# Patient Record
Sex: Male | Born: 1945 | Race: White | Hispanic: No | Marital: Married | Smoking: Current some day smoker
Health system: Southern US, Community
[De-identification: ages and names within clinical notes are randomized; demographics above are authoritative.]

## PROBLEM LIST (undated history)

## (undated) DIAGNOSIS — Z974 Presence of external hearing-aid: Secondary | ICD-10-CM

## (undated) DIAGNOSIS — E669 Obesity, unspecified: Secondary | ICD-10-CM

## (undated) DIAGNOSIS — J45909 Unspecified asthma, uncomplicated: Secondary | ICD-10-CM

## (undated) DIAGNOSIS — Z720 Tobacco use: Secondary | ICD-10-CM

## (undated) DIAGNOSIS — J309 Allergic rhinitis, unspecified: Secondary | ICD-10-CM

## (undated) DIAGNOSIS — E7800 Pure hypercholesterolemia, unspecified: Secondary | ICD-10-CM

## (undated) DIAGNOSIS — C61 Malignant neoplasm of prostate: Secondary | ICD-10-CM

## (undated) DIAGNOSIS — G4733 Obstructive sleep apnea (adult) (pediatric): Secondary | ICD-10-CM

## (undated) DIAGNOSIS — Z860100 Personal history of colon polyps, unspecified: Secondary | ICD-10-CM

## (undated) DIAGNOSIS — I4719 Other supraventricular tachycardia: Secondary | ICD-10-CM

## (undated) DIAGNOSIS — R001 Bradycardia, unspecified: Secondary | ICD-10-CM

## (undated) DIAGNOSIS — M109 Gout, unspecified: Secondary | ICD-10-CM

## (undated) DIAGNOSIS — R1319 Other dysphagia: Secondary | ICD-10-CM

## (undated) DIAGNOSIS — R7303 Prediabetes: Secondary | ICD-10-CM

## (undated) DIAGNOSIS — I471 Supraventricular tachycardia: Secondary | ICD-10-CM

## (undated) DIAGNOSIS — Z6841 Body Mass Index (BMI) 40.0 and over, adult: Secondary | ICD-10-CM

## (undated) DIAGNOSIS — I491 Atrial premature depolarization: Secondary | ICD-10-CM

## (undated) DIAGNOSIS — R0600 Dyspnea, unspecified: Secondary | ICD-10-CM

## (undated) DIAGNOSIS — N529 Male erectile dysfunction, unspecified: Secondary | ICD-10-CM

## (undated) DIAGNOSIS — I10 Essential (primary) hypertension: Secondary | ICD-10-CM

## (undated) HISTORY — DX: Presence of external hearing-aid: Z97.4

## (undated) HISTORY — DX: Allergic rhinitis, unspecified: J30.9

## (undated) HISTORY — DX: Obesity, unspecified: E66.9

## (undated) HISTORY — DX: Body Mass Index (BMI) 40.0 and over, adult: Z684

## (undated) HISTORY — DX: Dyspnea, unspecified: R06.00

## (undated) HISTORY — DX: Personal history of colon polyps, unspecified: Z86.0100

## (undated) HISTORY — DX: Other supraventricular tachycardia: I47.19

## (undated) HISTORY — DX: Malignant neoplasm of prostate: C61

## (undated) HISTORY — PX: KNEE ARTHROSCOPY: SUR90

## (undated) HISTORY — PX: HEEL SPUR SURGERY: SHX665

## (undated) HISTORY — DX: Bradycardia, unspecified: R00.1

## (undated) HISTORY — DX: Atrial premature depolarization: I49.1

## (undated) HISTORY — DX: Unspecified asthma, uncomplicated: J45.909

## (undated) HISTORY — PX: CHOLECYSTECTOMY: SHX55

## (undated) HISTORY — PX: HEMICOLECTOMY: SHX854

## (undated) HISTORY — DX: Other dysphagia: R13.19

## (undated) HISTORY — DX: Personal history of colonic polyps: Z86.010

## (undated) HISTORY — DX: Prediabetes: R73.03

## (undated) HISTORY — DX: Male erectile dysfunction, unspecified: N52.9

## (undated) HISTORY — DX: Tobacco use: Z72.0

## (undated) HISTORY — DX: Obstructive sleep apnea (adult) (pediatric): G47.33

## (undated) HISTORY — PX: PROSTATE SURGERY: SHX751

## (undated) HISTORY — DX: Gout, unspecified: M10.9

## (undated) HISTORY — DX: Supraventricular tachycardia: I47.1

## (undated) HISTORY — DX: Pure hypercholesterolemia, unspecified: E78.00

---

## 1998-02-28 ENCOUNTER — Ambulatory Visit (HOSPITAL_COMMUNITY): Admission: RE | Admit: 1998-02-28 | Discharge: 1998-02-28 | Payer: Self-pay | Admitting: Gastroenterology

## 1999-09-14 ENCOUNTER — Encounter: Payer: Self-pay | Admitting: *Deleted

## 1999-09-14 ENCOUNTER — Encounter: Admission: RE | Admit: 1999-09-14 | Discharge: 1999-09-14 | Payer: Self-pay | Admitting: *Deleted

## 2001-07-24 ENCOUNTER — Encounter: Payer: Self-pay | Admitting: *Deleted

## 2001-07-24 ENCOUNTER — Encounter: Admission: RE | Admit: 2001-07-24 | Discharge: 2001-07-24 | Payer: Self-pay | Admitting: *Deleted

## 2002-08-24 ENCOUNTER — Encounter: Admission: RE | Admit: 2002-08-24 | Discharge: 2002-08-24 | Payer: Self-pay

## 2003-06-25 ENCOUNTER — Encounter (INDEPENDENT_AMBULATORY_CARE_PROVIDER_SITE_OTHER): Payer: Self-pay | Admitting: Specialist

## 2003-06-25 ENCOUNTER — Ambulatory Visit (HOSPITAL_COMMUNITY): Admission: RE | Admit: 2003-06-25 | Discharge: 2003-06-25 | Payer: Self-pay | Admitting: Gastroenterology

## 2004-04-21 ENCOUNTER — Encounter: Admission: RE | Admit: 2004-04-21 | Discharge: 2004-04-21 | Payer: Self-pay | Admitting: Family Medicine

## 2004-10-17 ENCOUNTER — Ambulatory Visit (HOSPITAL_COMMUNITY): Admission: RE | Admit: 2004-10-17 | Discharge: 2004-10-17 | Payer: Self-pay | Admitting: Chiropractic Medicine

## 2006-06-11 ENCOUNTER — Encounter: Admission: RE | Admit: 2006-06-11 | Discharge: 2006-06-11 | Payer: Self-pay | Admitting: Cardiology

## 2006-06-12 ENCOUNTER — Ambulatory Visit (HOSPITAL_COMMUNITY): Admission: RE | Admit: 2006-06-12 | Discharge: 2006-06-12 | Payer: Self-pay | Admitting: Cardiology

## 2007-03-21 ENCOUNTER — Emergency Department (HOSPITAL_COMMUNITY): Admission: EM | Admit: 2007-03-21 | Discharge: 2007-03-21 | Payer: Self-pay | Admitting: Emergency Medicine

## 2008-10-07 ENCOUNTER — Encounter (INDEPENDENT_AMBULATORY_CARE_PROVIDER_SITE_OTHER): Payer: Self-pay | Admitting: Surgery

## 2008-10-07 ENCOUNTER — Inpatient Hospital Stay (HOSPITAL_COMMUNITY): Admission: RE | Admit: 2008-10-07 | Discharge: 2008-10-11 | Payer: Self-pay | Admitting: Surgery

## 2008-10-08 DIAGNOSIS — C61 Malignant neoplasm of prostate: Secondary | ICD-10-CM

## 2008-10-08 HISTORY — DX: Malignant neoplasm of prostate: C61

## 2009-12-13 ENCOUNTER — Encounter: Admission: RE | Admit: 2009-12-13 | Discharge: 2009-12-13 | Payer: Self-pay | Admitting: Family Medicine

## 2010-11-16 ENCOUNTER — Other Ambulatory Visit: Payer: Self-pay | Admitting: Gastroenterology

## 2011-01-22 LAB — BASIC METABOLIC PANEL
Chloride: 111 mEq/L (ref 96–112)
Creatinine, Ser: 0.73 mg/dL (ref 0.4–1.5)
GFR calc non Af Amer: >60 mL/min (ref >60)
Glucose, Bld: 103 mg/dL — ABNORMAL HIGH (ref 70–99)

## 2011-01-22 LAB — CBC
HCT: 31 % — ABNORMAL LOW (ref 39.0–52.0)
MCHC: 33.8 g/dL (ref 30.0–36.0)
MCV: 88.1 fL (ref 78.0–100.0)
Platelets: 251 10*3/uL (ref 150–400)

## 2011-02-01 ENCOUNTER — Other Ambulatory Visit (HOSPITAL_COMMUNITY): Payer: Self-pay | Admitting: Urology

## 2011-02-01 ENCOUNTER — Other Ambulatory Visit: Payer: Self-pay | Admitting: Urology

## 2011-02-01 ENCOUNTER — Encounter (HOSPITAL_COMMUNITY): Payer: 59

## 2011-02-01 ENCOUNTER — Ambulatory Visit (HOSPITAL_COMMUNITY)
Admission: RE | Admit: 2011-02-01 | Discharge: 2011-02-01 | Disposition: A | Discharge status: Home / Self Care | Payer: 59 | Source: Ambulatory Visit | Attending: Urology | Admitting: Urology

## 2011-02-01 ENCOUNTER — Other Ambulatory Visit (HOSPITAL_COMMUNITY): Payer: Medicare Other

## 2011-02-01 DIAGNOSIS — Z01818 Encounter for other preprocedural examination: Secondary | ICD-10-CM | POA: Insufficient documentation

## 2011-02-01 DIAGNOSIS — Z01812 Encounter for preprocedural laboratory examination: Secondary | ICD-10-CM | POA: Insufficient documentation

## 2011-02-01 DIAGNOSIS — Z9089 Acquired absence of other organs: Secondary | ICD-10-CM | POA: Insufficient documentation

## 2011-02-01 DIAGNOSIS — F17200 Nicotine dependence, unspecified, uncomplicated: Secondary | ICD-10-CM | POA: Insufficient documentation

## 2011-02-01 DIAGNOSIS — C61 Malignant neoplasm of prostate: Secondary | ICD-10-CM | POA: Insufficient documentation

## 2011-02-01 DIAGNOSIS — Z01810 Encounter for preprocedural cardiovascular examination: Secondary | ICD-10-CM | POA: Insufficient documentation

## 2011-02-01 LAB — CBC
MCH: 28.3 pg (ref 26.0–34.0)
MCHC: 32.8 g/dL (ref 30.0–36.0)
RBC: 5.08 MIL/uL (ref 4.22–5.81)
RDW: 12.9 % (ref 11.5–15.5)
WBC: 6.7 10*3/uL (ref 4.0–10.5)

## 2011-02-01 LAB — SURGICAL PCR SCREEN: Staphylococcus aureus: NEGATIVE

## 2011-02-01 LAB — BASIC METABOLIC PANEL
BUN: 12 mg/dL (ref 6–23)
CO2: 31 mEq/L (ref 19–32)
GFR calc Af Amer: >60 mL/min (ref >60)
Sodium: 141 mEq/L (ref 135–145)

## 2011-02-06 HISTORY — PX: PROSTATECTOMY: SHX69

## 2011-02-08 ENCOUNTER — Other Ambulatory Visit: Payer: Self-pay | Admitting: Urology

## 2011-02-08 ENCOUNTER — Inpatient Hospital Stay (HOSPITAL_COMMUNITY)
Admission: RE | Admit: 2011-02-08 | Discharge: 2011-02-09 | DRG: 708 | Disposition: A | Discharge status: Home / Self Care | Payer: Medicare Other | Source: Ambulatory Visit | Attending: Urology | Admitting: Urology

## 2011-02-08 DIAGNOSIS — C61 Malignant neoplasm of prostate: Principal | ICD-10-CM | POA: Diagnosis present

## 2011-02-08 DIAGNOSIS — F17200 Nicotine dependence, unspecified, uncomplicated: Secondary | ICD-10-CM | POA: Diagnosis present

## 2011-02-08 DIAGNOSIS — J45909 Unspecified asthma, uncomplicated: Secondary | ICD-10-CM | POA: Diagnosis present

## 2011-02-08 LAB — TYPE AND SCREEN
ABO/RH(D): A NEG
Antibody Screen: NEGATIVE

## 2011-02-08 LAB — ABO/RH: ABO/RH(D): A NEG

## 2011-02-08 LAB — HEMOGLOBIN AND HEMATOCRIT, BLOOD: HCT: 40.2 % (ref 39.0–52.0)

## 2011-02-10 NOTE — Op Note (Signed)
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  POSTOPERATIVE DIAGNOSIS:  Same.  PROCEDURES:  Robotic assisted laparoscopic radical prostatectomy (bilateral nerve sparing).  SURGEON:  Heloise Purpura, M.D.  ASSISTANT:  Delia Chimes, NP  ANESTHESIA:  General.  COMPLICATIONS:  None.  ESTIMATED BLOOD LOSS:  100 mL  INTRAVENOUS FLUIDS:  1700 mL crystalloid.  SPECIMEN:  Prostate and seminal vesicles.  DISPOSITION:  Specimen to pathology.  DRAINS: 1. 20-French coude catheter. 2. #19 Blake pelvic drain.  INDICATION:  Danny Lee is a 65 year old gentleman with clinically localized prostate cancer.  After a discussion regarding management options for treatment, he elected to proceed with surgical therapy and the above procedure.  The potential risks, complications, and alternative treatment options were discussed in detail and informed consent obtained.  DESCRIPTION OF PROCEDURE:  The patient was taken to the operating room, and a general anesthetic was administered.  He was given preoperative antibiotics, placed in the dorsal lithotomy position, and prepped and draped in the usual sterile fashion.  Preoperative time-out was performed.  A Foley catheter was inserted into the bladder and a site was selected just inferior into the left of the umbilicus for placement of the camera port.  The patient is significantly obese, and there was some difficulty gaining access to the peritoneum, although this was done with an open Hassan technique and under direct vision and  without complications.  A long 150-mm 12-mm camera port was then placed and a pneumoperitoneum established.  With the 0-degree lens, the abdomen was inspected, and there was no evidence of any intra-abdominal injuries or other abnormalities except for adhesions in the right upper quadrant related to his prior surgery.  However, these adhesions were not in the operative field for his planned prostatectomy.  The 8-mm robotic ports were then placed in the right lower quadrant, left lower quadrant, and far left lower quadrant.  A 5-mm port was placed in the right upper quadrant and a 12-mm port was placed in the far right lateral abdominal wall for laparoscopic assistance.  All ports were placed under direct vision without difficulty.  The surgical cart was then docked.  With the aid of the cautery scissors, the bladder was reflected posteriorly allowing entering the space of Retzius and identification of the endopelvic fascia and prostate.  The periprosthetic fat was then removed, and the underlying endopelvic fascia was incised from the apex back to the base of the prostate bilaterally and the underlying levator muscle fibers were swept laterally off the prostate thereby isolating the dorsal venous complex.  The dorsal vein was then stapled and divided with a 45-mm flex echelon stapler.  The bladder neck was identified with the aid of Foley catheter manipulation and  was divided anteriorly.  This exposed Foley catheter, and the catheter balloon was deflated thereby allowing the catheter be brought into the operative field and used to retract the prostate anteriorly.  The posterior bladder neck was then divided and dissection proceeded between the prostate and bladder until the vasa deferentia and seminal vesicles were identified.  The vasa deferentia were isolated, divided, and lifted anteriorly, and the seminal vesicles were dissected down to their tips with care to control seminal vesicle  arterial blood supply.  These structures were then also lifted anteriorly, and the space between Denonvilliers fascia and the anterior rectum was bluntly developed thereby isolating the vascular pedicles of the prostate.  The lateral prostatic fascia was then sharply incised allowing the neurovascular bundles to be released.  The vascular pedicles of the prostate were then ligated with Hem-o-lok clips above the level of the neurovascular bundles, and the vascular pedicles were then divided with sharp cold scissor dissection.  The neurovascular bundles were then swept off the apex of the prostate and urethra, and urethra was sharply transected allowing the prostate specimen to be disarticulated.  The pelvis was then copiously irrigated.  Hemostasis ensured.  There was no evidence for rectal injury.  Attention then turned to the urethral anastomosis.  A 2-0 Vicryl slip-knot was placed between Denonvilliers fascia, the posterior bladder neck, and the posterior urethra to reapproximate these structures.  A double-armed 3-0 Monocryl suture was then used to perform a 396-degree running tension- free anastomosis between these structures.  A new 20-French coude catheter was inserted into the bladder and irrigated.  There were no blood clots within the bladder, and the anastomosis appeared be watertight.  A #19 Blake drain was brought through the left robotic port and positioned appropriately within the pelvis.  It was secured to skin with a nylon suture.  The surgical cart was then undocked.  The right lateral 12-mm port site was closed with a 0 Vicryl suture placed laparoscopically.  The prostate specimen was removed intact within the Endopouch retrieval bag via the periumbilical port site.  This fascial opening was then closed with two running 0 Vicryl sutures.  All port sites were injected with quarter percent Marcaine and reapproximated at the skin level with staples.  Sterile dressings were  applied.  The patient appeared to tolerate procedure well and without complications. He was able to be extubated and transferred to the recovery unit in satisfactory condition.     Heloise Purpura, MD     LB/MEDQ  D:  02/08/2011  T:  02/08/2011  Job:  [id number]  Electronically Signed by Heloise Purpura MD on 02/10/2011 04:19:56 PM

## 2011-02-20 NOTE — Op Note (Signed)
NAME:  Danny Lee, HALIBURTON NO.:  [id number]   MEDICAL RECORD NO.:  [id number]          PATIENT TYPE:  INP   LOCATION:  0002                         FACILITY:  [facility]   PHYSICIAN:  [name], MD     DATE OF BIRTH:  [birth year]   DATE OF PROCEDURE:  DATE OF DISCHARGE:                               OPERATIVE REPORT   PRIMARY CARE PHYSICIAN:  [name], M.D.   GASTROENTEROLOGIST:  [name], M.D.   CARDIOLOGIST:  [name], M.D. with [facility].   SURGEON:  [name], M.D.   ASSISTANT:  [name]. [name], M.D.   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   POSTOPERATIVE DIAGNOSES:  1. Incompletely resected polyp of the ascending colon with high-grade      dysplasia.  2. Terminal ileal question scarring versus polyposis.   PROCEDURE PERFORMED:  Laparoscopically-assisted right hemicolectomy.  Laparoscopic lysis of adhesions.   ANESTHESIA:  1. General anesthesia.  2. Local anesthetic in a field block at all port sites and extraction      site.  3. On-Q pain pump.   DRAINS:  None.   ESTIMATED BLOOD LOSS:  50 mL   COMPLICATIONS:  None apparent.   INDICATIONS:  Mr. Danny Lee is a 65 year old morbidly obese male who was  found to have a large polyp near his cecum on a follow-up colonoscopy  with history of prior colon polyps.  [name] was able to resect some  of the polyp but not all of it.  Therefore surgical consultation was  requested.   Anatomy and physiology of the abdominal tract was discussed.  Pathophysiology of colon polyposis was discussed.  The possibility of a  colon cancer hidden within a polyp, especially with high-grade  dysplasia, was noted as well.  Options discussed.  Recommendation was  made for a segmental colectomy with laparoscopic versus possible open  technique.  Risks, benefits, and alternatives were discussed, questions  answered, and he  agreed to proceed.   OPERATIVE FINDINGS:  He had some moderate adhesions in his right upper  quadrant from his prior cholecystectomy.  He had about a 1-cm polyp base  at the junction of the cecal and ascending colon.  His ileocecal valve  was a little bit thickened and rough and there may be a few tiny polyps,  but the proximal ileum margin appeared clear, as well as the anastomosis  seemed clear.  He had some mild ileocecal mesentery thickening.  There  was no evidence of any metastatic disease or other abnormalities.  There  was no evidence of an incisional hernia..   DESCRIPTION OF PROCEDURE:  Informed consent was confirmed.  The patient  received IV antibiotic  prior to surgery.  He received oral alvimopan  just prior to surgery.  He received 5000 units of subcutaneous heparin  prior to surgery.  SCDs were on the entire case.  He underwent general  anesthesia without difficulty.  He was positioned in the low lithotomy  with arms tucked.  He had a Foley catheter sterilely placed.  His  abdomen was clipped, prepped and draped in a sterile fashion.   A 5-mm port was placed in the left upper quadrant with the patient in  steep reverse Trendelenburg and left side up after orogastric tube had  evacuated the stomach.  Entry was made in the abdomen without any  difficulty.  Camera inspection revealed no intra-abdominal injury.  Under direct visualization, 5-mm ports were placed in the left lower  quadrant, the umbilicus infraumbilically, and in the right flank.   The camera inspection revealed findings as noted above.  There were some  dense omental adhesions in the right upper quadrant.  I therefore  decided to do a mediolateral approach.  The ileocecal junction was  elevated anteriorly to identify the ileocecal pedicle.  The terminal  ileal mesentery was scored.  I was able to get into the retromesenteric  plane (Toldt fascia).  I was able to free off the right colon mesentery  off the  retroperitoneal attachments.  The duodenum could be seen and it  was kept posteriorly as the mesentery was swept off this.  This was  carried over proximally to the mid transverse colon and laterally  towards the cecum.   The ileocecal mesentery was scored, followed more proximally, and high  ligation of the ileocecal vessels was done using a LigaSure to good  result.   The colon was mobilized in a lateral medial fashion, first mobilizing  the cecum off its lateral pelvic sidewall attachments.  It was followed  up the right colon up to the hepatic flexure.  I followed more  proximally and freed off the terminal ileal mesentery as well all the  way to the midline for excellent mobilization.   The greater omentum was freed off its attachments on the anterior  abdominal wall along from its prior right subcostal incision and allowed  to reflect above the liver.  I was able to get into the lesser sac  between the transverse colon and greater omentum and free the mid and  proximal transverse mesocolon off its omental retroperitoneal  attachments, entering into the pocket that I had created inferiorly.  With that I was able to free off the hepatic flexure and provide  excellent mobilization.   A clamp was placed on the mesoappendix for identification.  Capnoperitoneum was evacuated.  I did a 6-cm oblique incision within his  prior open cholecystectomy incision and a wound protector was placed.  The right colon was eviscerated.  I could feel a mass near the ileocecal  valve and felt like I had adequate proximal and distal margins.  A side-  to-side stapled anastomosis was done using a GIA stapler.  The staple  line was closed using a TA  60 stapler.  The mesentery was taken in a  ray-like fashion to have good proximal and distal margins with excellent  high ligation of the ileocecal blood vessels.   I opened up the specimen on the table and found 1-cm base polyp that  seemed to correlate  with Dr. [name] pictures.  I could feel a little  bit of hardness and empty ileocecal valve but the more proximal end of  the ileum seemed clear.   The ileocolonic mesenteric defect was closed using interrupted 3-0 silk  figure-of-eight stitches as proximally as we could through the wound and  the wound protector was clamped and capnoperitoneum was reinstituted.  Proximally the colonic mesentery was closed using 3-0 Vicryl  intracorporeal suturing to good results.  I  placed a couple of silk  stitches at the proximal end of the ileocolonic side-to-side anastomosis  to keep tension off the staple line.  I ran the small bowel from the  ileocolonic anastomosis to the ligament of Treitz and the small bowel  laid well with no evidence of any internal hernias or turnings or  twists.  The greater omentum was allowed to fall down.  Irrigation was  done around the pelvis right gutter and the liver with clear return.  Hemostasis was excellent.  Capnoperitoneum was evacuated.  Ports were  removed.  I went ahead and placed the On-Q pain pump and closed the  right upper quadrant incision in 2 layers using #1 PDS, taking the  posterior rectus fascia in 1 layer and the transversalis and posterior  rectus fascia deeply and then the external oblique and the anterior  rectus fascia anteriorly.  The wound was irrigated copiously and closed  using interrupted 4-0 Monocryl subcuticular stitch.  Telfa wicks were  placed, antibiotic ointment was placed and sterile dressings applied.  The rest of the incisions were closed using a 4-0 Monocryl stitch with  sterile dressings applied as well.   The patient was extubated and sent to the recovery room in stable  condition.  I discussed postoperative care with the patient in detail  and I will discuss it with his wife.      [name], MD  Electronically Signed     SCG/MEDQ  D:  10/07/2008  T:  10/07/2008  Job:  [id number]   cc:   [name], M.D.  Fax:  [phone]   [name], M.D.  Fax: [phone]

## 2011-02-23 NOTE — Discharge Summary (Signed)
NAME:  Danny Lee, Danny Lee NO.:  [id number]   MEDICAL RECORD NO.:  [id number]          PATIENT TYPE:  INP   LOCATION:  1530                         [facility]ACILITY:  [facility]   PHYSICIAN:  [name], MD     DATE OF BIRTH:  01/01/1946   DATE OF ADMISSION:  10/07/2008  DATE OF DISCHARGE:  10/11/2008                               DISCHARGE SUMMARY   PRIMARY CARE PHYSICIAN:  [name], M.D.   GASTROENTEROLOGIST:  [name], M.D.   SURGEON:  [name], M.D.   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   OTHER DIAGNOSES:  1. Recurrent colon polyps.  2. Hypercholesterolemia.  3. Gout.  4. Asthma.  5. Erectile dysfunction.   PROCEDURE PERFORMED:  Laparoscopically assisted right hemicolectomy with  ileocolonic anastomosis on October 07, 2008.   HOSPITAL COURSE:  Mr. Suleiman is a 65 year old gentleman who had some  recurrent polyps and had a polyp in the ascending colon with some high-  grade dysplasia noted that was not able to be completely safely  resected.  [name] requested surgical consultation.  He was cleared by  Dr. Anne Fu of [facility].  The patient underwent laparoscopic  surgery.  Postoperatively he was advanced on his diet and had On-Q pain  pump in place.  He was able to ambulate.  By the time of discharge he  was tolerating a solid diet and without any fever or chills or nausea.  His pain was controlled on oral medications.  Pathology came back  consistent with stumps of tubovillous adenoma x2 with no cancer noted in  the specimen nor in 0 of 21 lymph nodes and no evidence of high-grade  dysplasia.  Based on improvement it was thought to be reasonable to  discharge home with the following instructions:   DISCHARGE INSTRUCTIONS:  1. He is to return to see me in about 2 weeks.  2. He should advance his diet as tolerated.  Call if he has any      fevers, chills, sweats, nausea or  vomiting, worsening pain,      drainage from his incisions or other concerns.  3. He should walk at least half hour daily and remain physically      active.  4. He should resume his home medications which include:   MEDICATIONS:  1. Crestor 10 mg daily.  2. Aspirin 81 mg daily.  3. Cialis p.r.n.  4. Aleve p.r.n.  5. Oxycodone 5-15 mg p.o. q.4 hours p.r.n. pain.  6. ________ p.r.n.  7. Phenergan p.r.n.      [name], MD  Electronically Signed     SCG/MEDQ  D:  11/27/2008  T:  11/27/2008  Job:  [id number]   cc:   [name], M.D.  Fax: [phone]   [name], M.D.  Fax: [phone]

## 2011-02-23 NOTE — Op Note (Signed)
NAME:  KAYNE, YUHAS NO.:  [id number]   MEDICAL RECORD NO.:  [id number]                   PATIENT TYPE:  AMB   LOCATION:  ENDO                                 FACILITY:  [facility]   PHYSICIAN:  [name], M.D.                 DATE OF BIRTH:  1946-04-30   DATE OF PROCEDURE:  06/25/2003  DATE OF DISCHARGE:  06/25/2003                                 OPERATIVE REPORT   PROCEDURE:  Colonoscopy with hot biopsy.   ENDOSCOPIST:  [name], M.D.   INDICATIONS FOR PROCEDURE:  The patient has a history of colon polyps and is  here for repeat screening.   INFORMED CONSENT:  Consent was signed after risks, benefits, methods and  options were thoroughly discussed in the past.   MEDICATIONS USED:  Demerol 50 mg, Versed 6 mg.   DESCRIPTION OF PROCEDURE:  Rectal inspection was pertinent for external  hemorrhoids.  Digital exam was negative.   The video colonoscope was inserted and easily advanced around the colon to  the cecum.  No abnormalities were seen on insertion.  The cecum was  identified by the appendiceal orifice and the ileocecal valve.  The scope  was inserted a short way into the terminal ileum.  Prep was adequate.  There  was some liquid stool that required washing and suctioning.  On slow  withdrawal through the colon, the cecum and the ascending were normal.  In  the more proximal transverse, a tiny polyp was seen and was hot biopsied x1.  On slow withdrawal through the left side of the colon no abnormalities were  seen.  Anorectal pull through on retroflexion confirmed some small  hemorrhoids.  The scope was reinserted a short way up the left side of the  colon.  Air was suctioned.  The scope was removed.  The patient tolerated  the procedure well.  There was no obvious immediate complication.   ENDOSCOPIC DIAGNOSES:  1. Internal and external hemorrhoids.  2. Tiny transverse polyp hot biopsied.  3. Otherwise within normal limits to the  cecum and the terminal ileum.    PLAN:  1. Happy to see back p.r.n.  2. Await pathology and will probable recheck screening in five years,     otherwise return care to Dr. Excell Seltzer for the customary health care     maintenance to include yearly rectals and guaiacs.                                               [name], M.D.    MEM/MEDQ  D:  06/25/2003  T:  06/28/2003  Job:  [id number]   cc:   Christella Noa, M.D.  [address] [place]  [place], [place] [address]  Fax: [phone]

## 2011-03-12 NOTE — Discharge Summary (Signed)
  NAME:  SOLON, ALBAN NO.:  [id number]  MEDICAL RECORD NO.:  [id number]           PATIENT TYPE:  I  LOCATION:  1441                         [facility]ACILITY[facility]  PHYSICIAN:  Heloise Purpura, MD      DATE OF BIRTH:  29-Nov-1945  DATE OF ADMISSION:  02/08/2011 DATE OF DISCHARGE:  02/09/2011                              DISCHARGE SUMMARY   ADMISSION DIAGNOSIS:  Clinically localized adenocarcinoma of the prostate (clinical stage T1c, NX, MX).  DISCHARGE DIAGNOSIS:  Clinically localized adenocarcinoma of the prostate (clinical stage T1c, NX, MX).  PROCEDURE:  Robotic-assisted laparoscopic radical prostatectomy (bilateral nerve sparing).  HISTORY AND PHYSICAL:  For full details, please see admission history and physical.  Briefly, Mr. Nagengast is a 65 year old gentleman who was found to have clinically localized adenocarcinoma of the prostate. After careful consideration regarding management options for treatment, he elected to proceed with surgical therapy and robotic-assisted laparoscopic radical prostatectomy.  HOSPITAL COURSE:  On Feb 08, 2011, he was taken to the operating room where he underwent the above named procedure which he tolerated well and without complications.  Postoperatively he was able to be transferred to a regular hospital room following recovery from anesthesia.  He was found to be hemodynamically stable as noted by hemoglobin of 13.5.  He was also found to be hemodynamically stable on postoperative day #2 as noted by hemoglobin of 12.4.  He was able to begin ambulation and clear liquid diet postoperatively.  He was able to continue this in the postoperative day #2.  He was found to have excellent urine output with minimal output from his pelvic drain on postoperative day #1. Therefore, his pelvic drain was removed.  He was ambulating without difficulty.  His pain was well managed and he was tolerating a clear liquid diet on the afternoon of  postoperative day #1.  Therefore he was felt stable for discharge home as he had met all discharge criteria.  DISPOSITION:  Home.  DISCHARGE MEDICATIONS:  He was instructed to resume all home medications consisting of Artificial Tears and Crestor.  In addition he was instructed to hold his herbal supplements and multivitamins as well as aspirin for 7 days.  He was provided a prescription for Vicodin to take as needed for pain and told to use Colace as a stool softener.  He was also given a prescription for Cipro to begin 1 day prior to return visit for removal of Foley catheter.  FOLLOWUP:  He will follow up in 7 to 10 days for further postoperative evaluation.     Delia Chimes, NP   ______________________________ Heloise Purpura, MD    MA/MEDQ  D:  03/01/2011  T:  03/01/2011  Job:  [id number]  Electronically Signed by Delia Chimes NP on 03/12/2011 10:01:20 AM Electronically Signed by Heloise Purpura MD on 03/12/2011 08:07:00 PM

## 2011-07-13 LAB — HEMOGLOBIN AND HEMATOCRIT, BLOOD: Hemoglobin: 14.3 g/dL (ref 13.0–17.0)

## 2011-07-26 LAB — DIFFERENTIAL
Eosinophils Absolute: 0.2
Eosinophils Relative: 3
Lymphocytes Relative: 23
Lymphs Abs: 1.5
Monocytes Relative: 8
Neutrophils Relative %: 66

## 2011-07-26 LAB — URINALYSIS, ROUTINE W REFLEX MICROSCOPIC
Bilirubin Urine: NEGATIVE
Glucose, UA: NEGATIVE
Hgb urine dipstick: NEGATIVE
Nitrite: NEGATIVE
Protein, ur: NEGATIVE
Urobilinogen, UA: 0.2

## 2011-07-26 LAB — BASIC METABOLIC PANEL
Calcium: 9.3
Creatinine, Ser: 0.69
GFR calc Af Amer: >60
Glucose, Bld: 109 — ABNORMAL HIGH
Potassium: 4.2
Sodium: 140

## 2011-07-26 LAB — CBC: WBC: 6.5

## 2011-11-10 ENCOUNTER — Emergency Department (HOSPITAL_BASED_OUTPATIENT_CLINIC_OR_DEPARTMENT_OTHER)
Admission: EM | Admit: 2011-11-10 | Discharge: 2011-11-10 | Disposition: A | Discharge status: Home / Self Care | Payer: Medicare Other | Attending: Emergency Medicine | Admitting: Emergency Medicine

## 2011-11-10 ENCOUNTER — Encounter (HOSPITAL_BASED_OUTPATIENT_CLINIC_OR_DEPARTMENT_OTHER): Payer: Self-pay | Admitting: *Deleted

## 2011-11-10 ENCOUNTER — Emergency Department (INDEPENDENT_AMBULATORY_CARE_PROVIDER_SITE_OTHER): Payer: Medicare Other

## 2011-11-10 DIAGNOSIS — W19XXXA Unspecified fall, initial encounter: Secondary | ICD-10-CM | POA: Insufficient documentation

## 2011-11-10 DIAGNOSIS — Z79899 Other long term (current) drug therapy: Secondary | ICD-10-CM | POA: Insufficient documentation

## 2011-11-10 DIAGNOSIS — IMO0002 Reserved for concepts with insufficient information to code with codable children: Secondary | ICD-10-CM

## 2011-11-10 DIAGNOSIS — Y9289 Other specified places as the place of occurrence of the external cause: Secondary | ICD-10-CM | POA: Insufficient documentation

## 2011-11-10 DIAGNOSIS — S63279A Dislocation of unspecified interphalangeal joint of unspecified finger, initial encounter: Secondary | ICD-10-CM | POA: Insufficient documentation

## 2011-11-10 DIAGNOSIS — I10 Essential (primary) hypertension: Secondary | ICD-10-CM | POA: Insufficient documentation

## 2011-11-10 DIAGNOSIS — S63259A Unspecified dislocation of unspecified finger, initial encounter: Secondary | ICD-10-CM

## 2011-11-10 HISTORY — DX: Essential (primary) hypertension: I10

## 2011-11-10 MED ORDER — OXYCODONE-ACETAMINOPHEN 5-325 MG PO TABS
1.0000 | ORAL_TABLET | Freq: Once | ORAL | Status: AC
  Administered 2011-11-10: 1 via ORAL
  Filled 2011-11-10: qty 1

## 2011-11-10 NOTE — ED Notes (Signed)
Pt tripped and fell landing on his left hand. Left little finger with obvious deformity. Moves slightly. Feels touch. Cap refill < 3 sec.

## 2011-11-10 NOTE — ED Provider Notes (Signed)
History   This chart was scribed for Gerhard Munch, MD by Melba Coon. The patient was seen in room MH09/MH09 and the patient's care was started at 4:10PM.    CSN: [id number]  Arrival date & time 11/10/11  1525   First MD Initiated Contact with Patient 11/10/11 1601      Chief Complaint  Patient presents with  . Finger Injury    (Consider location/radiation/quality/duration/timing/severity/associated sxs/prior treatment) HPI Danny Lee is a 66 y.o. male who presents to the Emergency Department complaining of constant, moderate to severe left hand (5th digit) pain pertaining to a fall with an onset today. Pt tripped on concrete and fell, landing on his left hand to brace his fall. Pt also hit his left knee but pt states it was fine. Finger is not mobile. Pt has arthritis and takes meds for it. No surgeries on left hand, but fractured left wrist several years ago.. No other pertinent medical problems.  Past Medical History  Diagnosis Date  . Hypertension     Past Surgical History  Procedure Date  . Prostate surgery     History reviewed. No pertinent family history.  History  Substance Use Topics  . Smoking status: Never Smoker   . Smokeless tobacco: Current User  . Alcohol Use: No      Review of Systems 10 Systems reviewed and are negative for acute change except as noted in the HPI.  Allergies  Review of patient's allergies indicates not on file.  Home Medications   Current Outpatient Rx  Name Route Sig Dispense Refill  . AMLODIPINE BESYLATE 5 MG PO TABS Oral Take 5 mg by mouth daily.    . ASPIRIN EC 81 MG PO TBEC Oral Take 81 mg by mouth daily.    . OMEGA-3 FATTY ACIDS 1000 MG PO CAPS Oral Take 2 g by mouth daily.    [name] LOSARTAN POTASSIUM 50 MG PO TABS Oral Take 50 mg by mouth daily.    . MELOXICAM 15 MG PO TABS Oral Take 15 mg by mouth daily.    . ADULT MULTIVITAMIN W/MINERALS CH Oral Take 1 tablet by mouth daily.    [name] SIMVASTATIN 40 MG PO TABS Oral  Take 40 mg by mouth every evening.      BP 117/71  Pulse 70  Temp(Src) 97.8 F (36.6 C) (Oral)  Resp 20  Ht 5\' 6"  (1.676 m)  Wt 240 lb (108.863 kg)  BMI 38.74 kg/m2  SpO2 96%  Physical Exam  Nursing note and vitals reviewed. Constitutional: He is oriented to person, place, and time. He appears well-developed and well-nourished.  HENT:  Head: Normocephalic and atraumatic.  Mouth/Throat: Oropharynx is clear and moist.  Eyes: Conjunctivae and EOM are normal. Pupils are equal, round, and reactive to light. No scleral icterus.  Neck: Normal range of motion. Neck supple. No thyromegaly present.  Cardiovascular: Normal rate, regular rhythm and normal heart sounds.  Exam reveals no gallop and no friction rub.   No murmur heard. Pulmonary/Chest: Effort normal and breath sounds normal. No stridor. He has no wheezes. He has no rales. He exhibits no tenderness.  Abdominal: Soft. Bowel sounds are normal. He exhibits no distension. There is no tenderness. There is no rebound.  Musculoskeletal: He exhibits edema (left hand 5th digit) and tenderness (left hand 5th digit).       Left hand: He exhibits decreased range of motion (left hand 5th digit) and deformity (left hand 5th digit).  Lymphadenopathy:    He  has no cervical adenopathy.  Neurological: He is alert and oriented to person, place, and time. Coordination normal.  Skin: Skin is warm. No rash noted. No erythema.  Psychiatric: He has a normal mood and affect. His behavior is normal.    ED Course  Reduction of dislocation Date/Time: 11/10/2011 6:00 PM Performed by: Gerhard Munch Authorized by: Gerhard Munch Consent: Verbal consent obtained. The procedure was performed in an emergent situation. Time out: Immediately prior to procedure a "time out" was called to verify the correct patient, procedure, equipment, support staff and site/side marked as required. Local anesthesia used: yes Anesthesia: see MAR for details Patient  sedated: no Patient tolerance: Patient tolerated the procedure well with no immediate complications. Comments: L fifth digit reduced on first attempt w no complications. Patient tolerated the procedure well.  Digit splinted w positioning maintained.   (including critical care time)  DIAGNOSTIC STUDIES: Oxygen Saturation is 96% on room air, adequate by my interpretation.    COORDINATION OF CARE:  4:20PM - EDMD makes pt aware of XR results; finger is not broken but dislocated; consults with pt about how he's going to fix dislocation and possible complications; EDMD relocates finger and puts a splint on it; checked distal pulses; EDMD plans for d/c and f/u with orthopedic or PCP; pt agrees with plan for d/c   Labs Reviewed - No data to display No results found.   No diagnosis found.  XR reviewed by me.  MDM  This generally well M p/w dislocated PIP fifth L digit.  XR demonstrate "tiny avulsion volar plate fracture."  Digit reduced on first attempt.  Patient splinted and d/c w ortho f/u.      Gerhard Munch, MD 11/11/11 [phone]

## 2014-02-19 ENCOUNTER — Encounter: Payer: Self-pay | Admitting: General Surgery

## 2014-02-19 DIAGNOSIS — R002 Palpitations: Secondary | ICD-10-CM | POA: Insufficient documentation

## 2014-02-19 DIAGNOSIS — E782 Mixed hyperlipidemia: Secondary | ICD-10-CM | POA: Insufficient documentation

## 2014-02-19 DIAGNOSIS — I10 Essential (primary) hypertension: Secondary | ICD-10-CM | POA: Insufficient documentation

## 2014-03-11 ENCOUNTER — Encounter: Payer: Self-pay | Admitting: Cardiology

## 2014-03-11 ENCOUNTER — Ambulatory Visit (INDEPENDENT_AMBULATORY_CARE_PROVIDER_SITE_OTHER): Payer: Medicare Other | Admitting: Cardiology

## 2014-03-11 VITALS — BP 115/68 | HR 95 | Ht 66.0 in | Wt 256.8 lb

## 2014-03-11 DIAGNOSIS — R0609 Other forms of dyspnea: Secondary | ICD-10-CM | POA: Insufficient documentation

## 2014-03-11 DIAGNOSIS — R0789 Other chest pain: Secondary | ICD-10-CM

## 2014-03-11 DIAGNOSIS — R002 Palpitations: Secondary | ICD-10-CM

## 2014-03-11 DIAGNOSIS — I498 Other specified cardiac arrhythmias: Secondary | ICD-10-CM

## 2014-03-11 DIAGNOSIS — R001 Bradycardia, unspecified: Secondary | ICD-10-CM

## 2014-03-11 DIAGNOSIS — I10 Essential (primary) hypertension: Secondary | ICD-10-CM

## 2014-03-11 DIAGNOSIS — R0602 Shortness of breath: Secondary | ICD-10-CM

## 2014-03-11 NOTE — Patient Instructions (Signed)
Your physician has requested that you have an echocardiogram. Echocardiography is a painless test that uses sound waves to create images of your heart. It provides your doctor with information about the size and shape of your heart and how well your heart's chambers and valves are working. This procedure takes approximately one hour. There are no restrictions for this procedure.  Your physician has recommended that you wear an event monitor. Event monitors are medical devices that record the heart's electrical activity. Doctors most often [place] these monitors to diagnose arrhythmias. Arrhythmias are problems with the speed or rhythm of the heartbeat. The monitor is a small, portable device. You can wear one while you do your normal daily activities. This is usually used to diagnose what is causing palpitations/syncope (passing out).  Your physician has requested that you have en exercise stress myoview. For further information please visit HugeFiesta.tn. Please follow instruction sheet, as given.  Your physician recommends that you schedule a follow-up appointment in: 4 weeks with Dr. Radford Pax.

## 2014-03-11 NOTE — Progress Notes (Addendum)
Patient ID: Danny Lee, male   DOB: Nov 01, 1945, 68 y.o.   MRN: [id number]  [address], [place], [place]  [address] Phone: [phone] Fax:  [phone]  Date:  03/11/2014   ID:  Danny Lee, DOB 09-11-1946, MRN [id number]  PCP:  Gara Kroner, MD  Cardiologist:  Fransico Him, MD   Electrophysiologist:  none   History of Present Illness: Danny Lee is a 68 y.o. male who presents for evaluation of bradycardia.   Patient reports he had an EKG a few weeks ago and HR was low. He was then referred here for evaluation.  Patient reports left sided chest pain. Lasts less than 30 seconds. Sharp pain. No radiation. No shortness of breath with this in particular. No diaphoresis. Not exertional. Nuclear stress test in 2009 per PCP note was low risk. No chest pressure. Occurs once a week.  Patient also reports a strange sensation in his chest "that is not a fluttering" and occurs when he is just sitting around. Not worse with exertion. Not much caffeine intake. No drug use. No alcohol use. Smokes cigars once in a while. This sensation occurs two times a month. Couple years duration. Not getting more prominent. No dizziness with this.   Patient also notes that he gets short of breath when walking to his building and when he walks up 15 steps. No orthopnea or PND. Sometimes ankles swell, but no other leg swelling. Has history of sleep apnea. Uses CPAP. Diagnosed with OSA in August.   Per the PCPs last note the patient had an echo 11/09 with normal EF, mild left atrial enlargement, and no significant valvular abnormalities.   Wt Readings from Last 3 Encounters:  03/11/14 256 lb 12.8 oz (116.484 kg)  11/10/11 240 lb (108.863 kg)     Past Medical History  Diagnosis Date  . Hypertension   . Hypercholesteremia   . Gout     rare  . Asthma     mild  . ED (erectile dysfunction)   . Obesity   . Dyspnea     Nuc stress 11/09-low rish,nl EF,ECHO 11/09-nl EF, mild left  atrial enlargement(4.4cm) no signifcant vavlular abnormalities.   . Prostate cancer 2010  . Hearing aid worn     Bilateral  . OSA (obstructive sleep apnea)     SPLIT 06/16/13 ESS 10,AHI 36/hr REM 71/hr, RDI same, O2 minn 68%, CPAP 12 w AHI 2, APAP recommeded    Current Outpatient Prescriptions  Medication Sig Dispense Refill  . aspirin EC 81 MG tablet Take 81 mg by mouth daily.      [name] atorvastatin (LIPITOR) 40 MG tablet Take 40 mg by mouth every morning.      . fish oil-omega-3 fatty acids 1000 MG capsule Take 2 g by mouth daily.      [name] losartan (COZAAR) 50 MG tablet Take 100 mg by mouth daily.       . Multiple Vitamin (MULITIVITAMIN WITH MINERALS) TABS Take 1 tablet by mouth daily.      [name] oxybutynin (DITROPAN-XL) 10 MG 24 hr tablet Take 10 mg by mouth at bedtime.       No current facility-administered medications for this visit.    Allergies:    Allergies  Allergen Reactions  . Amlodipine Besylate Other (See Comments)    LE edema on 5 MG     Social History:  The patient  reports that he has been smoking Cigars.  He uses smokeless tobacco.  He reports that he does not drink alcohol or use illicit drugs.   Family History:  The patient's family history includes CAD in his brother; Hypertension in his brother.   ROS:  Please see the history of present illness.   All other systems reviewed and negative.   PHYSICAL EXAM: VS:  BP 115/68  Pulse 95  Ht 5\' 6"  (1.676 m)  Wt 256 lb 12.8 oz (116.484 kg)  BMI 41.47 kg/m2 Well nourished, well developed, in no acute distress HEENT: normal Neck: no JVD, no carotid bruits Cardiac:  normal S1, S2; brady regular; no murmur Lungs:  clear to auscultation bilaterally, no wheezing, rhonchi or rales Abd: soft, nontender, no hepatomegaly Ext: no edema Skin: warm and dry Neuro:  CNs 2-12 intact, no focal abnormalities noted  EKG:  Sinus brady, no ST or T wave changes (done at outside office)     ASSESSMENT AND PLAN:  1. Sinus Bradycardia on  no rate slowing meds 2. Dyspnea on exertion of unclear etiology ? Coronary ischemia vs. Bradycardia with low CO vs. obesity 3. Atypical chest pain but with CRF including obesity, HTN, dyslipidemia and family history of CAD 4. Palpitations 5. HTN - well controlled 6. Hyperlipidemia  Patient with multiple risk factors for coronary disease, including HTN, HLD, and smoking. The concern given the constellation of symptoms is that his symptoms could be related to a cardiac cause. Additionally his sinus bradycardia could be dropping to lower levels leading to his palpitations and funny feeling in his chest and additionally causing his dyspnea. Other possibilities for causes of his dyspnea could be OSA/OHS, obesity and deconditioning.  Given his symptoms we will order a stress myoview to evaluate for CAD and to look for an chronotropic response with exercise given his bradycardia. We will order an echo to evaluate his heart for causes of dyspnea - assess LVF and diastolic function An event monitor will be used to evaluate his palpitations.  He will follow-up in 4 weeks with Dr Radford Pax.   Signed, Tommi Rumps, MD 03/11/2014 1:40 PM    The patient was seen, examined, interviewed and data reviewed.  Agree with note as outlined above with minor modifications.  Complains of SOB of unclear etiology - need to evaluate for coronary ischemia, symptomatic bradycardia.  Will get 2D echo, event monitor and stress myoview.  Followup with me in 4 weeks  Signed: Fransico Him, MD 03/11/2014

## 2014-03-15 ENCOUNTER — Encounter: Payer: Self-pay | Admitting: *Deleted

## 2014-03-15 ENCOUNTER — Encounter (INDEPENDENT_AMBULATORY_CARE_PROVIDER_SITE_OTHER): Payer: Medicare Other

## 2014-03-15 DIAGNOSIS — R002 Palpitations: Secondary | ICD-10-CM

## 2014-03-15 NOTE — Progress Notes (Signed)
Patient ID: Danny Lee, male   DOB: [birth year], 68 y.o.   MRN: [id number] [employer] 30 day cardiac event monitor applied to patient.

## 2014-03-31 ENCOUNTER — Ambulatory Visit (HOSPITAL_COMMUNITY): Payer: Medicare Other | Attending: Cardiology | Admitting: Radiology

## 2014-03-31 ENCOUNTER — Ambulatory Visit (HOSPITAL_BASED_OUTPATIENT_CLINIC_OR_DEPARTMENT_OTHER): Payer: Medicare Other | Admitting: Radiology

## 2014-03-31 ENCOUNTER — Other Ambulatory Visit: Payer: Self-pay | Admitting: General Surgery

## 2014-03-31 VITALS — BP 93/65 | HR 50 | Ht 66.0 in | Wt 251.0 lb

## 2014-03-31 DIAGNOSIS — R079 Chest pain, unspecified: Secondary | ICD-10-CM

## 2014-03-31 DIAGNOSIS — R0600 Dyspnea, unspecified: Secondary | ICD-10-CM

## 2014-03-31 DIAGNOSIS — R0602 Shortness of breath: Secondary | ICD-10-CM

## 2014-03-31 MED ORDER — PERFLUTREN PROTEIN A MICROSPH IV SUSP
1.5000 mL | Freq: Once | INTRAVENOUS | Status: AC
  Administered 2014-03-31: 1.5 mL via INTRAVENOUS

## 2014-03-31 MED ORDER — TECHNETIUM TC 99M SESTAMIBI GENERIC - CARDIOLITE
30.0000 | Freq: Once | INTRAVENOUS | Status: AC | PRN
  Administered 2014-03-31: 30 via INTRAVENOUS

## 2014-03-31 NOTE — Progress Notes (Signed)
[facility] [address] [place], [place] [address] [phone]    Cardiology Nuclear Med Study  Danny Lee is a 68 y.o. male     MRN : [id number]     DOB: [birth year]  Procedure Date: 03/31/2014  Nuclear Med Background Indication for Stress Test:  Evaluation for Ischemia History:  No prior known history of CAD, 2009 Myocardial Perfusion Imaging-Normal, EF=62% Cardiac Risk Factors: Family History - CAD, Hypertension, Lipids and Smoker  Symptoms: Chest Pain with/without exertion (last occurrence yesterday), DOE and Palpitations   Nuclear Pre-Procedure Caffeine/Decaff Intake:  None NPO After: 7:00pm   Lungs:  clear O2 Sat: 95% on room air. IV 0.9% NS with Angio Cath:  22g  IV Site: R Hand  IV Started by:  Crissie Figures, RN  Chest Size (in):  50 Cup Size: n/a  Height: 5\' 6"  (1.676 m)  Weight:  251 lb (113.853 kg)  BMI:  Body mass index is 40.53 kg/(m^2). Tech Comments:  Patient took Losartan last night. Irven Baltimore, RN.    Nuclear Med Study 1 or 2 day study: 2 day  Stress Test Type:  Stress  Reading MD: N/A  Order Authorizing Provider:  Fransico Him, MD  Resting Radionuclide: Technetium 32m Sestamibi  Resting Radionuclide Dose: 33.0 mCi on 04/05/14   Stress Radionuclide:  Technetium 80m Sestamibi  Stress Radionuclide Dose: 33.0 mCi on 03/31/14           Stress Protocol Rest HR: 45 Stress HR: 133  Rest BP: 93/65 Stress BP: 192/69  Exercise Time (min): 6:00 METS: 7.0   Predicted Max HR: 152 bpm % Max HR: 87.5 bpm Rate Pressure Product: 25536   Dose of Adenosine (mg):  n/a Dose of Lexiscan: n/a mg  Dose of Atropine (mg): n/a Dose of Dobutamine: n/a mcg/kg/min (at max HR)  Stress Test Technologist: Irven Baltimore, RN  Nuclear Technologist:  Annye Rusk, CNMT     Rest Procedure:  Myocardial perfusion imaging was performed at rest 45 minutes following the intravenous administration of Technetium 82m Sestamibi. Rest ECG: NSR - Normal  EKG  Stress Procedure:  The patient exercised on the treadmill utilizing the Bruce Protocol for 6:00 minutes, RPE=16. The patient stopped due to DOE, fatigue and denied any chest pain. There was a hypertensive response to exercise from baseline. Technetium 74m Sestamibi was injected at peak exercise and myocardial perfusion imaging was performed after a brief delay. Stress ECG: No significant change from baseline ECG  QPS Raw Data Images:  Normal; no motion artifact; normal heart/lung ratio. Stress Images:  Normal homogeneous uptake in all areas of the myocardium. Rest Images:  Normal homogeneous uptake in all areas of the myocardium. Subtraction (SDS):  No evidence of ischemia. Transient Ischemic Dilatation (Normal <1.22):  1.05 Lung/Heart Ratio (Normal <0.45):  0.37  Quantitative Gated Spect Images QGS EDV:  135 ml QGS ESV:  61 ml  Impression Exercise Capacity:  Good exercise capacity. BP Response:  Normal blood pressure response. Clinical Symptoms:  No significant symptoms noted. ECG Impression:  No significant ST segment change suggestive of ischemia. Comparison with Prior Nuclear Study: No images to compare  Overall Impression:  Normal stress nuclear study.  LV Ejection Fraction: 55%.  LV Wall Motion:  NL LV Function; NL Wall Motion  Darlin Coco MD

## 2014-03-31 NOTE — Progress Notes (Signed)
Echocardiogram performed.  

## 2014-04-01 ENCOUNTER — Other Ambulatory Visit (INDEPENDENT_AMBULATORY_CARE_PROVIDER_SITE_OTHER): Payer: Medicare Other

## 2014-04-01 DIAGNOSIS — R0602 Shortness of breath: Secondary | ICD-10-CM

## 2014-04-01 LAB — BRAIN NATRIURETIC PEPTIDE: Pro B Natriuretic peptide (BNP): 13 pg/mL (ref 0.0–100.0)

## 2014-04-01 NOTE — Progress Notes (Signed)
Quick Note:  Preliminary report reviewed by triage nurse and sent to MD desk. ______ 

## 2014-04-02 ENCOUNTER — Encounter: Payer: Self-pay | Admitting: General Surgery

## 2014-04-05 ENCOUNTER — Ambulatory Visit (HOSPITAL_COMMUNITY): Payer: Medicare Other | Attending: Cardiology

## 2014-04-05 DIAGNOSIS — R0989 Other specified symptoms and signs involving the circulatory and respiratory systems: Secondary | ICD-10-CM

## 2014-04-05 MED ORDER — TECHNETIUM TC 99M SESTAMIBI GENERIC - CARDIOLITE
30.0000 | Freq: Once | INTRAVENOUS | Status: AC | PRN
  Administered 2014-04-05: 30 via INTRAVENOUS

## 2014-04-06 ENCOUNTER — Telehealth: Payer: Self-pay | Admitting: Cardiology

## 2014-04-06 NOTE — Telephone Encounter (Signed)
Pt notified to send monitor back on July 8th. He was hoping to send back early. I explained it is a 30 day monitor and the Drs generally like pts to wear for the entire 30 days unless they were notified to take off early.

## 2014-04-06 NOTE — Telephone Encounter (Signed)
New message    Patient wants to know  How long should he wear his heart monitor.

## 2014-04-06 NOTE — Telephone Encounter (Signed)
Forward to [place] to call pt

## 2014-04-27 ENCOUNTER — Telehealth: Payer: Self-pay | Admitting: Cardiology

## 2014-04-27 NOTE — Telephone Encounter (Signed)
Please let patient know that heart monitor showed sinus bradycardia with lowest HR 41bpm during sleep and highest HR 101bpm.  He had frequent PAC's  that are benign

## 2014-04-27 NOTE — Telephone Encounter (Signed)
Pt is aware.  

## 2015-03-08 ENCOUNTER — Other Ambulatory Visit: Payer: Self-pay | Admitting: Family Medicine

## 2015-03-08 ENCOUNTER — Ambulatory Visit
Admission: RE | Admit: 2015-03-08 | Discharge: 2015-03-08 | Disposition: A | Discharge status: Home / Self Care | Payer: Medicare Other | Source: Ambulatory Visit | Attending: Family Medicine | Admitting: Family Medicine

## 2015-03-08 DIAGNOSIS — M545 Low back pain: Secondary | ICD-10-CM

## 2015-03-09 ENCOUNTER — Other Ambulatory Visit: Payer: Self-pay | Admitting: Family Medicine

## 2015-03-09 DIAGNOSIS — M5432 Sciatica, left side: Secondary | ICD-10-CM

## 2015-03-09 DIAGNOSIS — M545 Low back pain, unspecified: Secondary | ICD-10-CM

## 2015-03-11 ENCOUNTER — Ambulatory Visit
Admission: RE | Admit: 2015-03-11 | Discharge: 2015-03-11 | Disposition: A | Discharge status: Home / Self Care | Payer: Medicare Other | Source: Ambulatory Visit | Attending: Family Medicine | Admitting: Family Medicine

## 2015-03-11 DIAGNOSIS — M545 Low back pain, unspecified: Secondary | ICD-10-CM

## 2015-03-11 DIAGNOSIS — M5432 Sciatica, left side: Secondary | ICD-10-CM

## 2016-02-16 ENCOUNTER — Encounter: Payer: Self-pay | Admitting: Nurse Practitioner

## 2016-02-16 NOTE — Telephone Encounter (Signed)
Message For: ON CALL              Taken 11-MAY-17 at 11:13AM by LSJ ------------------------------------------------------------  Brandt Loosen              CID  [id number]   Patient  SAME                  Pt's Dr  Hassell Done        Area Code  336  Phone#  [address]  [phone] [place]                                                                                  Disp:Y/N  Y  If Y = C/B If No Response In 54minutes  ============================================================

## 2016-02-16 NOTE — Telephone Encounter (Signed)
This encounter was created in error - please disregard.

## 2016-04-03 ENCOUNTER — Other Ambulatory Visit: Payer: Self-pay | Admitting: Family Medicine

## 2016-04-03 ENCOUNTER — Ambulatory Visit
Admission: RE | Admit: 2016-04-03 | Discharge: 2016-04-03 | Disposition: A | Discharge status: Home / Self Care | Payer: Medicare Other | Source: Ambulatory Visit | Attending: Family Medicine | Admitting: Family Medicine

## 2016-04-03 DIAGNOSIS — M25561 Pain in right knee: Secondary | ICD-10-CM

## 2016-04-06 ENCOUNTER — Encounter: Payer: Self-pay | Admitting: Physician Assistant

## 2016-04-06 NOTE — Progress Notes (Signed)
Cardiology Office Note    Date:  04/09/2016  ID:  Danny Lee, DOB 12-May-1946, MRN [id number] PCP:  Danny Kroner, MD  Cardiologist:  Danny Lee in 2015  Chief Complaint: evaluate bradycardia  History of Present Illness:  Danny Lee is a 70 y.o. male with history of HTN, HLD, OSA, obesity, prostate CA, mild asthma, sinus bradycardia, brief PACs/PAT by monitor 2015 who presents for evaluation of bradycardia. He was evaluated in 2015 by Dr. Radford Lee for complaints of dyspnea and EKG showing HR of 45 at PCP's office. F/u event monitor showed <1% bradycardia - min HR 45, average HR 61, occasional PACs, few brief runs of non-sustained atrial tach (4-6 beats). 2D Echo 03/2014: EF 55%, grade 2 DD, no sig valvular abnormalities. Nuc 03/2014 was normal.  He presents back to clinic today for continued bradycardia. EKG from PCP's office 04/03/2016: sinus bradycardia 42bpm -> given that HR was slower than prior, formal consult was requested. He denies any specific symptoms related to this at rest. He occasionally feels dizzy when he bends over then stands up quickly. No history of presyncope or syncope. He does report fatigue on exertion but also states he is very sedentary in general. With brisk exertion in the office, his HR only got up to 63. Pulse ox WNL. He also reports rare fleeting ping of chest pain lasting only a few seconds in the middle of his chest, not worse with exertion or any other provocative measures. It resolves spontaneously. Labs from 04/03/16 show Na 142, K 4.2, Cl 106, BUN 14, Cr 0.82, normal LFTs, calcium 9.3, TSH 3.02, LDL 63, A1C 5.6, Hgb 13.2.  Past Medical History  Diagnosis Date  . Hypertension   . Hypercholesteremia   . Gout     rare  . Asthma     mild  . ED (erectile dysfunction)   . Obesity   . Dyspnea     a. Prior eval in 2009 nl. b.   . Prostate cancer ([place]) 2010  . Hearing aid worn     Bilateral  . OSA (obstructive sleep apnea)     SPLIT 06/16/13 ESS 10,AHI 36/hr  REM 71/hr, RDI same, O2 minn 68%, CPAP 12 w AHI 2, APAP recommeded  . Premature atrial contractions     a. By event monitor 2015.  [name] PAT (paroxysmal atrial tachycardia) ([place])     a. Brief nonsustained atrial tach on event monitor in 2015 - 4-6 beats.  . Sinus bradycardia     a. HR 45 in 2015.    Past Surgical History  Procedure Laterality Date  . Prostate surgery    . Hemicolectomy Right     and Lysis of adhesions  . Heel spur surgery    . Knee arthroscopy Left   . Prostatectomy  02/2011  . Cholecystectomy      Current Medications: Current Outpatient Prescriptions  Medication Sig Dispense Refill  . losartan (COZAAR) 100 MG tablet Take 1 tablet by mouth every morning.    [name] aspirin EC 81 MG tablet Take 81 mg by mouth daily.    [name] atorvastatin (LIPITOR) 40 MG tablet Take 40 mg by mouth every morning.    . fish oil-omega-3 fatty acids 1000 MG capsule Take 2 g by mouth daily.    . Multiple Vitamin (MULITIVITAMIN WITH MINERALS) TABS Take 1 tablet by mouth daily.    [name] oxybutynin (DITROPAN-XL) 10 MG 24 hr tablet Take 10 mg by mouth at bedtime.     No  current facility-administered medications for this visit.     Allergies:   Amlodipine besylate   Social History   Social History  . Marital Status: Married    Spouse Name: N/A  . Number of Children: N/A  . Years of Education: N/A   Social History Main Topics  . Smoking status: Current Some Day Smoker    Types: Cigars  . Smokeless tobacco: Current User  . Alcohol Use: No  . Drug Use: No  . Sexual Activity: Not Asked   Other Topics Concern  . None   Social History Narrative     Family History:  The patient's family history includes CAD in his brother; Heart attack in his brother; Hypertension in his brother.   ROS:   Please see the history of present illness.  All other systems are reviewed and otherwise negative.    PHYSICAL EXAM:   VS:  BP 115/68 mmHg  Pulse 53  Ht 5\' 6"  (1.676 m)  Wt 254 lb 6.4 oz (115.395 kg)   BMI 41.08 kg/m2  BMI: Body mass index is 41.08 kg/(m^2). GEN: Well nourished, well developed obese WM, in no acute distress HEENT: normocephalic, atraumatic Neck: no JVD, carotid bruits, or masses Cardiac: RRR; no murmurs, rubs, or gallops, no edema  Respiratory:  clear to auscultation bilaterally, normal work of breathing GI: soft, nontender, nondistended, + BS MS: no deformity or atrophy Skin: warm and dry, no rash Neuro:  Alert and Oriented x 3, Strength and sensation are intact, follows commands Psych: euthymic mood, full affect  Wt Readings from Last 3 Encounters:  04/09/16 254 lb 6.4 oz (115.395 kg)  03/11/15 247 lb (112.038 kg)  03/31/14 251 lb (113.853 kg)      Studies/Labs Reviewed:   EKG:  EKG was ordered today and personally reviewed by me and demonstrates SB 53bpm, sinus arrhythmia, TWI III, otherwise no acute changes.  Recent Labs: See above  Additional studies/ records that were reviewed today include: Summarized above.    ASSESSMENT & PLAN:   1. Sinus bradycardia - generally asymptomatic at rest, but he does report fatigue with exertion. I question the possibility of chronotropic incompetence. Will undertake ETT to formally assess. Given recent HR dropping as low as 42, will also update a 24-hour Holter to review HR trend. Note he is not on any AVN blocking agents and thyroid function was normal as above. Further management will depend on findings above. 2. Chest pain - highly atypical. As above, proceed with ETT. Anticipate continued observation for more typical symptoms. Continue ASA. 3. PACs/PAT by monitor - no symptoms reported. 4. Essential HTN - controlled on current regimen. 5. OSA - he reports compliance with CPAP.  Disposition: Will tentatively schedule f/u with EP in 2 weeks in case further discussions regarding PPM are needed following chronotropic assessment.   Medication Adjustments/Labs and Tests Ordered: Current medicines are reviewed at length  with the patient today.  Concerns regarding medicines are outlined above. Medication changes, Labs and Tests ordered today are summarized above and listed in the Patient Instructions accessible in Encounters.   Raechel Ache PA-C  04/09/2016 8:47 AM    [address], [place], [place]  [address] Phone: [phone]; Fax: [phone]

## 2016-04-09 ENCOUNTER — Encounter: Payer: Self-pay | Admitting: Physician Assistant

## 2016-04-09 ENCOUNTER — Ambulatory Visit (INDEPENDENT_AMBULATORY_CARE_PROVIDER_SITE_OTHER): Payer: Medicare Other | Admitting: Physician Assistant

## 2016-04-09 VITALS — BP 115/68 | HR 53 | Ht 66.0 in | Wt 254.4 lb

## 2016-04-09 DIAGNOSIS — I10 Essential (primary) hypertension: Secondary | ICD-10-CM | POA: Diagnosis not present

## 2016-04-09 DIAGNOSIS — Z9989 Dependence on other enabling machines and devices: Secondary | ICD-10-CM

## 2016-04-09 DIAGNOSIS — I491 Atrial premature depolarization: Secondary | ICD-10-CM | POA: Diagnosis not present

## 2016-04-09 DIAGNOSIS — G4733 Obstructive sleep apnea (adult) (pediatric): Secondary | ICD-10-CM | POA: Insufficient documentation

## 2016-04-09 DIAGNOSIS — R001 Bradycardia, unspecified: Secondary | ICD-10-CM

## 2016-04-09 DIAGNOSIS — I471 Supraventricular tachycardia: Secondary | ICD-10-CM

## 2016-04-09 NOTE — Patient Instructions (Signed)
Medication Instructions:  Your physician recommends that you continue on your current medications as directed. Please refer to the Current Medication list given to you today.   Labwork: -None  Testing/Procedures: Your physician has requested that you have an exercise tolerance test. For further information please visit HugeFiesta.[email or web address]. Please also follow instruction sheet, as given.  Your physician has recommended that you wear a holter monitor. Holter monitors are medical devices that record the heart's electrical activity. Doctors most often use these monitors to diagnose arrhythmias. Arrhythmias are problems with the speed or rhythm of the heartbeat. The monitor is a small, portable device. You can wear one while you do your normal daily activities. This is usually used to diagnose what is causing palpitations/syncope (passing out).    Follow-Up: Your physician recommends that you keep your scheduled  follow-up appointment with Dr. Curt Bears.   Any Other Special Instructions Will Be Listed Below (If Applicable).     If you need a refill on your cardiac medications before your next appointment, please call your pharmacy.

## 2016-04-12 ENCOUNTER — Other Ambulatory Visit: Payer: Self-pay | Admitting: Physician Assistant

## 2016-04-12 ENCOUNTER — Encounter: Payer: Self-pay | Admitting: Cardiology

## 2016-04-12 DIAGNOSIS — I491 Atrial premature depolarization: Secondary | ICD-10-CM

## 2016-04-12 DIAGNOSIS — R001 Bradycardia, unspecified: Secondary | ICD-10-CM

## 2016-04-12 DIAGNOSIS — R0789 Other chest pain: Secondary | ICD-10-CM

## 2016-04-12 DIAGNOSIS — R42 Dizziness and giddiness: Secondary | ICD-10-CM

## 2016-04-12 DIAGNOSIS — I471 Supraventricular tachycardia: Secondary | ICD-10-CM

## 2016-04-12 DIAGNOSIS — R5383 Other fatigue: Secondary | ICD-10-CM

## 2016-04-16 ENCOUNTER — Ambulatory Visit (INDEPENDENT_AMBULATORY_CARE_PROVIDER_SITE_OTHER): Payer: Medicare Other

## 2016-04-16 ENCOUNTER — Encounter (INDEPENDENT_AMBULATORY_CARE_PROVIDER_SITE_OTHER): Payer: Self-pay

## 2016-04-16 DIAGNOSIS — I471 Supraventricular tachycardia: Secondary | ICD-10-CM | POA: Diagnosis not present

## 2016-04-16 DIAGNOSIS — R001 Bradycardia, unspecified: Secondary | ICD-10-CM | POA: Diagnosis not present

## 2016-04-16 DIAGNOSIS — I491 Atrial premature depolarization: Secondary | ICD-10-CM

## 2016-04-16 DIAGNOSIS — R42 Dizziness and giddiness: Secondary | ICD-10-CM | POA: Diagnosis not present

## 2016-04-16 DIAGNOSIS — I4719 Other supraventricular tachycardia: Secondary | ICD-10-CM

## 2016-04-18 ENCOUNTER — Ambulatory Visit: Payer: Medicare Other | Admitting: Cardiology

## 2016-04-20 ENCOUNTER — Other Ambulatory Visit: Payer: Self-pay | Admitting: Gastroenterology

## 2016-04-24 ENCOUNTER — Ambulatory Visit: Payer: Medicare Other | Admitting: Cardiology

## 2016-04-25 ENCOUNTER — Telehealth: Payer: Self-pay | Admitting: Cardiology

## 2016-04-25 NOTE — Telephone Encounter (Signed)
Informed pt of monitor results. Pt already scheduled to see Dr.Camnitz on 7/26. Pt verbalized understanding and was in agreement with this plan.

## 2016-04-25 NOTE — Telephone Encounter (Signed)
F/u  Pt returning RN phone call- monitor results; Please call back and discuss.

## 2016-04-25 NOTE — Telephone Encounter (Signed)
New message ° ° ° ° ° ° °Pt returning nurse call  °

## 2016-05-02 ENCOUNTER — Ambulatory Visit: Payer: Medicare Other | Admitting: Cardiology

## 2016-05-03 ENCOUNTER — Ambulatory Visit (INDEPENDENT_AMBULATORY_CARE_PROVIDER_SITE_OTHER): Payer: Medicare Other

## 2016-05-03 DIAGNOSIS — R0789 Other chest pain: Secondary | ICD-10-CM

## 2016-05-03 DIAGNOSIS — R001 Bradycardia, unspecified: Secondary | ICD-10-CM

## 2016-05-03 DIAGNOSIS — R5383 Other fatigue: Secondary | ICD-10-CM

## 2016-05-03 LAB — EXERCISE TOLERANCE TEST
CHL CUP STRESS STAGE 1 DBP: 77 mmHg
CHL CUP STRESS STAGE 1 GRADE: 0 %
CHL CUP STRESS STAGE 1 HR: 80 {beats}/min
CHL CUP STRESS STAGE 1 SBP: 106 mmHg
CHL CUP STRESS STAGE 1 SPEED: 0 mph
CHL CUP STRESS STAGE 2 HR: 80 {beats}/min
CHL CUP STRESS STAGE 3 GRADE: 0 %
CHL CUP STRESS STAGE 3 SPEED: 1 mph
CHL CUP STRESS STAGE 4 GRADE: 0.1 %
CHL CUP STRESS STAGE 4 SPEED: 1 mph
CHL CUP STRESS STAGE 5 DBP: 68 mmHg
CHL CUP STRESS STAGE 5 HR: 101 {beats}/min
CHL CUP STRESS STAGE 6 DBP: 79 mmHg
CHL CUP STRESS STAGE 8 GRADE: 0 %
CHL CUP STRESS STAGE 8 HR: 106 {beats}/min
CHL CUP STRESS STAGE 8 SBP: 166 mmHg
CHL CUP STRESS STAGE 8 SPEED: 0 mph
CHL CUP STRESS STAGE 9 DBP: 61 mmHg
CHL CUP STRESS STAGE 9 GRADE: 0 %
CHL CUP STRESS STAGE 9 HR: 63 {beats}/min
CHL CUP STRESS STAGE 9 SBP: 122 mmHg
CSEPED: 7 min
CSEPEDS: 20 s
CSEPEW: 9 METS
CSEPHR: 87 %
CSEPPHR: 129 {beats}/min
CSEPPMHR: 86 %
MPHR: 150 {beats}/min
RPE: 17
Rest HR: 60 {beats}/min
Stage 2 Grade: 0 %
Stage 2 Speed: 0 mph
Stage 3 HR: 72 {beats}/min
Stage 4 HR: 71 {beats}/min
Stage 5 Grade: 10 %
Stage 5 SBP: 155 mmHg
Stage 5 Speed: 1.7 mph
Stage 6 Grade: 12 %
Stage 6 HR: 114 {beats}/min
Stage 6 SBP: 174 mmHg
Stage 6 Speed: 2.5 mph
Stage 7 Grade: 14 %
Stage 7 HR: 129 {beats}/min
Stage 7 Speed: 3.4 mph
Stage 8 DBP: 62 mmHg
Stage 9 Speed: 0 mph

## 2016-05-09 ENCOUNTER — Ambulatory Visit (INDEPENDENT_AMBULATORY_CARE_PROVIDER_SITE_OTHER): Payer: Medicare Other | Admitting: Cardiology

## 2016-05-09 ENCOUNTER — Encounter (INDEPENDENT_AMBULATORY_CARE_PROVIDER_SITE_OTHER): Payer: Self-pay

## 2016-05-09 ENCOUNTER — Encounter: Payer: Self-pay | Admitting: Cardiology

## 2016-05-09 VITALS — BP 110/72 | HR 60 | Ht 66.0 in | Wt 253.6 lb

## 2016-05-09 DIAGNOSIS — R001 Bradycardia, unspecified: Secondary | ICD-10-CM | POA: Diagnosis not present

## 2016-05-09 NOTE — Progress Notes (Signed)
Electrophysiology Office Note   Date:  05/09/2016   ID:  Lee Danny, DOB 13-Oct-1945, MRN [id number]  PCP:  Danny Kroner, MD  Cardiologist:  Danny Lee Primary Electrophysiologist:  Danny Lee Danny Leeds, MD    Chief Complaint  Patient presents with  . Follow-up     History of Present Illness: Danny Lee is a 70 y.o. male who presents today for electrophysiology evaluation.   Hx HTN, HLD, OSA, obesity, prostate CA, mild asthma, sinus bradycardia, brief PACs/PAT by monitor 2015 who presents for evaluation of bradycardia. He was evaluated in 2015 by Dr. Radford Lee for complaints of dyspnea and EKG showing HR of 45 at PCP's office. F/u event monitor showed <1% bradycardia - min HR 45, average HR 61, occasional PACs, few brief runs of non-sustained atrial tach (4-6 beats). 2D Echo 03/2014: EF 55%, grade 2 DD, no sig valvular abnormalities. Nuc 03/2014 was normal.   EKG from PCP's office 04/03/2016: sinus bradycardia 42bpm -> given that HR was slower than prior, formal consult was requested. No rest symptoms but dizzy when bends over and stands quickly. He does have DOE but lives a sedentary lifestyle.He says that he feels well most the time and does not have dizziness, lightheadedness. He was referred to cardiology clinic, as it was noted that his heart rate was slow in his primary physician's office.  Today, he denies symptoms of palpitations, chest pain, shortness of breath, orthopnea, PND, lower extremity edema, claudication, dizziness, presyncope, syncope, bleeding, or neurologic sequela. The patient is tolerating medications without difficulties and is otherwise without complaint today.    Past Medical History:  Diagnosis Date  . Asthma    mild  . Dyspnea    a. Prior eval in 2009 nl. b.   . ED (erectile dysfunction)   . Gout    rare  . Hearing aid worn    Bilateral  . Hypercholesteremia   . Hypertension   . Obesity   . OSA (obstructive sleep apnea)    SPLIT 06/16/13 ESS 10,AHI  36/hr REM 71/hr, RDI same, O2 minn 68%, CPAP 12 w AHI 2, APAP recommeded  . PAT (paroxysmal atrial tachycardia) ([place])    a. Brief nonsustained atrial tach on event monitor in 2015 - 4-6 beats.  . Premature atrial contractions    a. By event monitor 2015.  Danny Lee Prostate cancer ([name]) 2010  . Sinus bradycardia    a. HR 45 in 2015.   Past Surgical History:  Procedure Laterality Date  . CHOLECYSTECTOMY    . HEEL SPUR SURGERY    . HEMICOLECTOMY Right    and Lysis of adhesions  . KNEE ARTHROSCOPY Left   . PROSTATE SURGERY    . PROSTATECTOMY  02/2011     Current Outpatient Prescriptions  Medication Sig Dispense Refill  . aspirin EC 81 MG tablet Take 81 mg by mouth daily.    Danny Lee atorvastatin (LIPITOR) 40 MG tablet Take 40 mg by mouth every morning.    . fish oil-omega-3 fatty acids 1000 MG capsule Take 2 g by mouth daily.    Danny Lee losartan (COZAAR) 100 MG tablet Take 1 tablet by mouth every morning.    . Multiple Vitamin (MULITIVITAMIN WITH MINERALS) TABS Take 1 tablet by mouth daily.     No current facility-administered medications for this visit.     Allergies:   Amlodipine besylate   Social History:  The patient  reports that he has been smoking Cigars.  He uses smokeless tobacco. He reports that  he does not drink alcohol or use drugs.   Family History:  The patient's family history includes CAD in his brother; Heart attack in his brother; Hypertension in his brother.    ROS:  Please see the history of present illness.   Otherwise, review of systems is positive for none.   All other systems are reviewed and negative.    PHYSICAL EXAM: VS:  BP 110/72 (BP Location: Left Arm, Patient Position: Sitting, Cuff Size: Large)   Pulse 60   Ht 5\' 6"  (1.676 m)   Wt 253 lb 9.6 oz (115 kg)   SpO2 95%   BMI 40.93 kg/m  , BMI Body mass index is 40.93 kg/m. GEN: Well nourished, well developed, in no acute distress  HEENT: normal  Neck: no JVD, carotid bruits, or masses Cardiac: RRR; no murmurs,  rubs, or gallops,no edema  Respiratory:  clear to auscultation bilaterally, normal work of breathing GI: soft, nontender, nondistended, + BS MS: no deformity or atrophy  Skin: warm and dry Neuro:  Strength and sensation are intact Psych: euthymic mood, full affect  EKG:  EKG is not ordered today. Personal review of the ekg ordered 04/09/16 shows sinus bradycardia, rate 53   Recent Labs: No results found for requested labs within last 8760 hours.    Lipid Panel  No results found for: CHOL, TRIG, HDL, CHOLHDL, VLDL, LDLCALC, LDLDIRECT   Wt Readings from Last 3 Encounters:  05/09/16 253 lb 9.6 oz (115 kg)  04/09/16 254 lb 6.4 oz (115.4 kg)  03/11/15 247 lb (112 kg)      Other studies Reviewed: Additional studies/ records that were reviewed today include:  TTE 2015 - Normal LV size and systolic function, EF [id number]. Moderate diastolic dysfunction. Normal RV size and systolic function. No significant valvular abnormalities.  Holter 04/16/16  Sinus bradycardia, normal Sinus Rhythm and sinus tachycardia with average heart rate 66bpm. The heart rate ranged from 36 (during sleep) to 141 bpm.  Occasional PACs  Supraventricular tachycardia with several runs up to 35 beats in a row at 141bpm.  ETT 05/03/16   Blood pressure demonstrated a normal response to exercise.  There was no ST segment deviation noted during stress.  Exercise time 7 minutes and 20 seconds-good  Low risk exercise treadmill test with no electrocardiographic evidence of ischemia   ASSESSMENT AND PLAN:  1.  Sinus bradycardia - generally asymptomatic at rest, but he does report fatigue with exertion.On exercise treadmill testing, he was able to get his heart rate up to 129 bpm, with an initial heart rate of 60. He was bradycardic on his Holter monitor, but that was mainly when he was asleep. He did not have any other worrisome bradycardia episodes. Due to that, he does not need a pacemaker at this time. I told  him that if he does have worsening symptoms of fatigue, dyspnea, or weakness, to call the office back and we would be happy to see him. Would avoid rate controlling medications at this time.  2. Chest pain - highly atypical. ETT shows no evidence of ischemia  3. PACs/PAT by monitor - no symptoms reported.  4. Essential HTN - currently well controlled  5. OSA - wears CPAP   Current medicines are reviewed at length with the patient today.   The patient does not have concerns regarding his medicines.  The following changes were made today:  none  Labs/ tests ordered today include:  No orders of the defined types were placed in this  encounter.    Disposition:   FU with Courtne Lighty PRN  Signed, Milam Allbaugh Danny Leeds, MD  05/09/2016 8:34 AM     [facility] [address] [place] [address] [phone] (office) [phone] (fax)

## 2016-05-09 NOTE — Patient Instructions (Signed)
Medication Instructions:  Your physician recommends that you continue on your current medications as directed. Please refer to the Current Medication list given to you today.  * If you need a refill on your cardiac medications before your next appointment, please call your pharmacy.   Labwork: None ordered  Testing/Procedures: None ordered  Follow-Up: No follow up is needed at this time with Dr. Camnitz.  He will see you on an as needed basis.   Thank you for choosing [facility]!!   Ramy Greth, RN [phone]     

## 2016-10-15 ENCOUNTER — Other Ambulatory Visit: Payer: Self-pay | Admitting: Family Medicine

## 2016-10-15 ENCOUNTER — Ambulatory Visit
Admission: RE | Admit: 2016-10-15 | Discharge: 2016-10-15 | Disposition: A | Discharge status: Home / Self Care | Payer: Medicare Other | Source: Ambulatory Visit | Attending: Family Medicine | Admitting: Family Medicine

## 2016-10-15 DIAGNOSIS — R05 Cough: Secondary | ICD-10-CM

## 2016-10-15 DIAGNOSIS — R059 Cough, unspecified: Secondary | ICD-10-CM

## 2019-09-24 DIAGNOSIS — M25561 Pain in right knee: Secondary | ICD-10-CM | POA: Insufficient documentation

## 2020-04-04 ENCOUNTER — Telehealth: Payer: Self-pay | Admitting: Cardiology

## 2020-04-04 NOTE — Telephone Encounter (Signed)
New Message   Patient is calling because he would like for his wife to come to the appt with him due to his memory issues.

## 2020-04-04 NOTE — Telephone Encounter (Signed)
Left detailed message informing pt that someone can accompany him to appt w/ Camnitz this week. appt notes updated to reflect this.

## 2020-04-07 ENCOUNTER — Ambulatory Visit (INDEPENDENT_AMBULATORY_CARE_PROVIDER_SITE_OTHER): Payer: Medicare Other | Admitting: Cardiology

## 2020-04-07 ENCOUNTER — Telehealth: Payer: Self-pay | Admitting: *Deleted

## 2020-04-07 ENCOUNTER — Encounter: Payer: Self-pay | Admitting: Cardiology

## 2020-04-07 ENCOUNTER — Other Ambulatory Visit: Payer: Self-pay

## 2020-04-07 VITALS — BP 112/66 | HR 57 | Ht 66.0 in | Wt 254.0 lb

## 2020-04-07 DIAGNOSIS — R079 Chest pain, unspecified: Secondary | ICD-10-CM

## 2020-04-07 DIAGNOSIS — R0789 Other chest pain: Secondary | ICD-10-CM | POA: Diagnosis not present

## 2020-04-07 DIAGNOSIS — R001 Bradycardia, unspecified: Secondary | ICD-10-CM | POA: Diagnosis not present

## 2020-04-07 NOTE — Telephone Encounter (Signed)
Patient enrolled for [employer] to ship a 14 day ZIO XT long term holter monitor to [address] [place], [place] [address].

## 2020-04-07 NOTE — Patient Instructions (Signed)
Medication Instructions:  Your physician recommends that you continue on your current medications as directed. Please refer to the Current Medication list given to you today.  *If you need a refill on your cardiac medications before your next appointment, please call your pharmacy*   Lab Work: None ordered If you have labs (blood work) drawn today and your tests are completely normal, you will receive your results only by: [name] MyChart Message (if you have MyChart) OR . A paper copy in the mail If you have any lab test that is abnormal or we need to change your treatment, we will call you to review the results.   Testing/Procedures: Your physician has requested that you have cardiac CT. Cardiac computed tomography (CT) is a painless test that uses an x-ray machine to take clear, detailed pictures of your heart.  Please follow instructions below under "other instructions".  Your physician has recommended that you wear a 2 week holter monitor. Holter monitors are medical devices that record the heart's electrical activity. Doctors most often use these monitors to diagnose arrhythmias. Arrhythmias are problems with the speed or rhythm of the heartbeat. The monitor is a small, portable device. You can wear one while you do your normal daily activities. This is usually used to diagnose what is causing palpitations/syncope (passing out).   Follow-Up: At [facility], you and your health needs are our priority.  As part of our continuing mission to provide you with exceptional heart care, we have created designated Provider Care Teams.  These Care Teams include your primary Cardiologist (physician) and Advanced Practice Providers (APPs -  Physician Assistants and Nurse Practitioners) who all work together to provide you with the care you need, when you need it.  We recommend signing up for the patient portal called "MyChart".  Sign up information is provided on this After Visit Summary.  MyChart is  used to connect with patients for Virtual Visits (Telemedicine).  Patients are able to view lab/test results, encounter notes, upcoming appointments, etc.  Non-urgent messages can be sent to your provider as well.   To learn more about what you can do with MyChart, go to [email or web address].    Your next appointment:   3 month(s)  The format for your next appointment:   In Person  Provider:   Allegra Lai, MD   Thank you for choosing [facility]!!   Trinidad Curet, RN [phone]    Other Instructions  Your cardiac CT will be scheduled at one of the below locations:   [facility] [address] [place], [place] [address] [phone]  [place] [address] [place], [place] [address] [phone]  If scheduled at [facility], please arrive at the [facility] main entrance of [facility] 30 minutes prior to test start time. Proceed to the [facility] Radiology Department (first floor) to check-in and test prep.  If scheduled at [facility], please arrive 15 mins early for check-in and test prep.  Please follow these instructions carefully (unless otherwise directed):  Hold all erectile dysfunction medications at least 3 days (72 hrs) prior to test.  On the Night Before the Test: . Be sure to Drink plenty of water. . Do not consume any caffeinated/decaffeinated beverages or chocolate 12 hours prior to your test. . Do not take any antihistamines 12 hours prior to your test.  On the Day of the Test: . Drink plenty of water.  Do not drink any water within one hour of the test. . Do not eat any food 4 hours prior to the test. . You may take your regular medications prior to the test.       After the Test: . Drink plenty of water. . After receiving IV contrast, you may experience a mild flushed feeling. This is normal. . On occasion, you may experience  a mild rash up to 24 hours after the test. This is not dangerous. If this occurs, you can take Benadryl 25 mg and increase your fluid intake. . If you experience trouble breathing, this can be serious. If it is severe call 911 IMMEDIATELY. If it is mild, please call our office.  Once we have confirmed authorization from your insurance company, we will call you to set up a date and time for your test. Based on how quickly your insurance processes prior authorizations requests, please allow up to 4 weeks to be contacted for scheduling your Cardiac CT appointment. Be advised that routine Cardiac CT appointments could be scheduled as many as 8 weeks after your provider has ordered it.  For non-scheduling related questions, please contact the cardiac imaging nurse navigator should you have any questions/concerns: Marchia Bond, Cardiac Imaging Nurse Navigator Burley Saver, Interim Cardiac Imaging Nurse [facility] Direct Office Dial: [phone]   For scheduling needs, including cancellations and rescheduling, please call Vivien Rota at [phone].      ZIO XT- Long Term Monitor Instructions   Your physician has requested you wear your ZIO patch monitor_______days.   This is a single patch monitor.  [employer] supplies one patch monitor per enrollment.  Additional stickers are not available.   Please do not apply patch if you will be having a Nuclear Stress Test, Echocardiogram, Cardiac CT, MRI, or Chest Xray during the time frame you would be wearing the monitor. The patch cannot be worn during these tests.  You cannot remove and re-apply the ZIO XT patch monitor.   Your ZIO patch monitor will be sent USPS Priority mail from [facility] directly to your home address. The monitor may also be mailed to a PO BOX if home delivery is not available.   It may take 3-5 days to receive your monitor after you have been enrolled.   Once you have received you monitor,  please review enclosed instructions.  Your monitor has already been registered assigning a specific monitor serial # to you.   Applying the monitor   Shave hair from upper left chest.   Hold abrader disc by orange tab.  Rub abrader in 40 strokes over left upper chest as indicated in your monitor instructions.   Clean area with 4 enclosed alcohol pads .  Use all pads to assure are is cleaned thoroughly.  Let dry.   Apply patch as indicated in monitor instructions.  Patch will be place under collarbone on left side of chest with arrow pointing upward.   Rub patch adhesive wings for 2 minutes.Remove white label marked "1".  Remove white label marked "2".  Rub patch adhesive wings for 2 additional minutes.   While looking in a mirror, press and release button in center of patch.  A small green light will flash 3-4 times .  This will be your only indicator the monitor has been turned on.     Do not shower for the first 24 hours.  You may shower after the first 24 hours.   Press button  if you feel a symptom. You will hear a small click.  Record Date, Time and Symptom in the Patient Log Book.   When you are ready to remove patch, follow instructions on last 2 pages of Patient Log Book.  Stick patch monitor onto last page of Patient Log Book.   Place Patient Log Book in [place] box.  Use locking tab on box and tape box closed securely.  The [employer] and [employer] has [employer] on it.  Please place in mailbox as soon as possible.  Your physician should have your test results approximately 7 days after the monitor has been mailed back to [facility].   Call [place] at [phone] if you have questions regarding your ZIO XT patch monitor.  Call them immediately if you see an orange light blinking on your monitor.   If your monitor falls off in less than 4 days contact our Monitor department at [phone].  If your monitor becomes loose or falls off after 4 days call  [employer] at [phone] for suggestions on securing your monitor.

## 2020-04-07 NOTE — Progress Notes (Signed)
Electrophysiology Office Note   Date:  04/07/2020   ID:  Danny Lee, DOB 30-Jan-1946, MRN [id number]  PCP:  Antony Contras, MD  Cardiologist:  Radford Pax Primary Electrophysiologist:  Constance Haw, MD    No chief complaint on file.    History of Present Illness: Danny Lee is a 74 y.o. male who presents today for electrophysiology evaluation.   He has a history of hypertension, hyperlipidemia, OSA, obesity, prostate cancer, asthma, and sinus bradycardia.  He presents for evaluation of his bradycardia.  He had a prior evaluation was found to be minimally symptomatic and thus no further testing was performed.  He did wear a cardiac monitor and had an exercise treadmill test which were unremarkable.   Today, denies symptoms of palpitations, shortness of breath, orthopnea, PND, lower extremity edema, claudication, dizziness, presyncope, syncope, bleeding, or neurologic sequela. The patient is tolerating medications without difficulties.  His main complaint today is fatigue.  He says that he feels like he wants to fall asleep at all times.  He does have sleep apnea, though he says that this is well controlled as he recently saw his sleep physician.  He says he gets tired when walking long distances.  He also has been having some chest pain.  Chest pain occurs when he is active.  He is unclear whether or not it gets better with rest.  This is happened 2 times in the last few weeks.   Past Medical History:  Diagnosis Date  . Allergic rhinitis   . Asthma    mild  . BMI 40.0-44.9, adult ([place])   . Dyspnea    a. Prior eval in 2009 nl. b.   . ED (erectile dysfunction)   . Esophageal dysphagia   . Gout    rare  . Hearing aid worn    Bilateral  . Hx of colonic polyps   . Hypercholesteremia   . Hypertension   . Obesity   . OSA (obstructive sleep apnea)    SPLIT 06/16/13 ESS 10,AHI 36/hr REM 71/hr, RDI same, O2 minn 68%, CPAP 12 w AHI 2, APAP recommeded  . PAT (paroxysmal atrial  tachycardia) ([place])    a. Brief nonsustained atrial tach on event monitor in 2015 - 4-6 beats.  . Prediabetes   . Premature atrial contractions    a. By event monitor 2015.  [name] Prostate cancer ([place]) 2010  . Sinus bradycardia    a. HR 45 in 2015.  . Tobacco abuse    Past Surgical History:  Procedure Laterality Date  . CHOLECYSTECTOMY    . HEEL SPUR SURGERY    . HEMICOLECTOMY Right    and Lysis of adhesions  . KNEE ARTHROSCOPY Left   . PROSTATE SURGERY    . PROSTATECTOMY  02/2011     Current Outpatient Medications  Medication Sig Dispense Refill  . atorvastatin (LIPITOR) 40 MG tablet Take 40 mg by mouth every morning.    . fish oil-omega-3 fatty acids 1000 MG capsule Take 2 g by mouth daily.    [name] losartan (COZAAR) 100 MG tablet Take 1 tablet by mouth every morning.    . Multiple Vitamin (MULITIVITAMIN WITH MINERALS) TABS Take 1 tablet by mouth daily.     No current facility-administered medications for this visit.    Allergies:   Amlodipine besylate and Aspirin   Social History:  The patient  reports that he has been smoking cigars. He uses smokeless tobacco. He reports that he does not drink alcohol  and does not use drugs.   Family History:  The patient's family history includes CAD in his brother; Heart attack in his brother; Hypertension in his brother.    ROS:  Please see the history of present illness.   Otherwise, review of systems is positive for none.   All other systems are reviewed and negative.   PHYSICAL EXAM: VS:  BP 112/66   Pulse (!) 57   Ht 5\' 6"  (1.676 m)   Wt 254 lb (115.2 kg)   BMI 41.00 kg/m  , BMI Body mass index is 41 kg/m. GEN: Well nourished, well developed, in no acute distress  HEENT: normal  Neck: no JVD, carotid bruits, or masses Cardiac: RRR; no murmurs, rubs, or gallops,no edema  Respiratory:  clear to auscultation bilaterally, normal work of breathing GI: soft, nontender, nondistended, + BS MS: no deformity or atrophy  Skin: warm and  dry Neuro:  Strength and sensation are intact Psych: euthymic mood, full affect  EKG:  EKG is ordered today. Personal review of the ekg ordered shows sinus rhythm, rate 57   Recent Labs: No results found for requested labs within last 8760 hours.    Lipid Panel  No results found for: CHOL, TRIG, HDL, CHOLHDL, VLDL, LDLCALC, LDLDIRECT   Wt Readings from Last 3 Encounters:  04/07/20 254 lb (115.2 kg)  05/09/16 253 lb 9.6 oz (115 kg)  04/09/16 254 lb 6.4 oz (115.4 kg)      Other studies Reviewed: Additional studies/ records that were reviewed today include:  TTE 2015 - Normal LV size and systolic function, EF 26%. Moderate diastolic dysfunction. Normal RV size and systolic function. No significant valvular abnormalities.  Holter 04/16/16  Sinus bradycardia, normal Sinus Rhythm and sinus tachycardia with average heart rate 66bpm. The heart rate ranged from 36 (during sleep) to 141 bpm.  Occasional PACs  Supraventricular tachycardia with several runs up to 35 beats in a row at 141bpm.  ETT 05/03/16   Blood pressure demonstrated a normal response to exercise.  There was no ST segment deviation noted during stress.  Exercise time 7 minutes and 20 seconds-good  Low risk exercise treadmill test with no electrocardiographic evidence of ischemia   ASSESSMENT AND PLAN:  1.  Sinus bradycardia -he has symptoms of weakness and fatigue.  He also has obstructive sleep apnea.  It is unclear to me whether or not his weakness is due to his sleep apnea or due to bradycardia.  He does say that his CPAP was recently adjusted.  We Sheyanne Munley have him wear a 2-week monitor to further evaluate for symptoms that correlate with bradycardic episodes.  We did discuss risks and benefits of pacemaker implant which include bleeding, tamponade, infection, pneumothorax.  He understands these risks and if needed, would be agreeable to pacemaker implant.  2. Essential HTN -currently well controlled  3.  OSA -CPAP compliance encouraged  4.  Chest pain: Has had 2 episodes.  Both of which are with activity.  He says that they went away on its own and does not remember if he rested.  We Ebany Bowermaster order a coronary CT to further evaluate his coronary arteries.  Case discussed with primary physician  Current medicines are reviewed at length with the patient today.   The patient does not have concerns regarding his medicines.  The following changes were made today: None  Labs/ tests ordered today include:  Orders Placed This Encounter  Procedures  . CT CORONARY MORPH W/CTA COR W/SCORE W/CA W/CM &/  OR WO/CM  . CT CORONARY FRACTIONAL FLOW RESERVE DATA PREP  . CT CORONARY FRACTIONAL FLOW RESERVE FLUID ANALYSIS  . LONG TERM MONITOR (3-14 DAYS)  . EKG 12-Lead     Disposition:   FU with Pattrick Bady 3 months  Signed, Lulamae Skorupski Meredith Leeds, MD  04/07/2020 9:32 AM     [facility] [address] [place] [address] [phone] (office) [phone] (fax)

## 2020-04-13 ENCOUNTER — Telehealth: Payer: Self-pay | Admitting: Cardiology

## 2020-04-13 DIAGNOSIS — I491 Atrial premature depolarization: Secondary | ICD-10-CM

## 2020-04-13 DIAGNOSIS — I4719 Other supraventricular tachycardia: Secondary | ICD-10-CM

## 2020-04-13 DIAGNOSIS — Z0189 Encounter for other specified special examinations: Secondary | ICD-10-CM

## 2020-04-13 DIAGNOSIS — I471 Supraventricular tachycardia: Secondary | ICD-10-CM

## 2020-04-13 NOTE — Telephone Encounter (Signed)
Spoke with the pt and scheduled him to come into the office on 7/27, to have a BMET done, prior to upcoming Cardiac CT on 7/29, per CT protocol. Pt verbalized understanding and agrees with this plan.

## 2020-04-13 NOTE — Telephone Encounter (Signed)
Pt got a call from the Hospital CT about needing labs done before his upcoming CT 05/05/20. There are no lab orders in the patient's cart

## 2020-04-18 ENCOUNTER — Telehealth: Payer: Self-pay | Admitting: *Deleted

## 2020-04-18 ENCOUNTER — Telehealth: Payer: Self-pay | Admitting: Cardiology

## 2020-04-18 NOTE — Telephone Encounter (Signed)
patient states that he needs to speak to someone regarding his heart monitor....states that it keeps coming off and is not sure that its working

## 2020-04-18 NOTE — Telephone Encounter (Signed)
Patients ZIO XT patch fell off after 2 days.  Patient attempted to tape back on but it keeps falling off.  Monitor will be cancelled.  Patient will be enrolled for a replacement monitor to be shipped to his home.

## 2020-04-20 ENCOUNTER — Ambulatory Visit (INDEPENDENT_AMBULATORY_CARE_PROVIDER_SITE_OTHER): Payer: Medicare Other

## 2020-04-20 DIAGNOSIS — R001 Bradycardia, unspecified: Secondary | ICD-10-CM | POA: Diagnosis not present

## 2020-05-03 ENCOUNTER — Other Ambulatory Visit: Payer: Medicare Other

## 2020-05-03 ENCOUNTER — Other Ambulatory Visit: Payer: Self-pay

## 2020-05-03 DIAGNOSIS — I471 Supraventricular tachycardia: Secondary | ICD-10-CM

## 2020-05-03 DIAGNOSIS — I491 Atrial premature depolarization: Secondary | ICD-10-CM

## 2020-05-03 DIAGNOSIS — Z0189 Encounter for other specified special examinations: Secondary | ICD-10-CM

## 2020-05-03 LAB — BASIC METABOLIC PANEL
BUN/Creatinine Ratio: 15 (ref 10–24)
BUN: 11 mg/dL (ref 8–27)
CO2: 27 mmol/L (ref 20–29)
Calcium: 9.4 mg/dL (ref 8.6–10.2)
Chloride: 104 mmol/L (ref 96–106)
Creatinine, Ser: 0.72 mg/dL — ABNORMAL LOW (ref 0.76–1.27)
GFR calc Af Amer: 106 mL/min/{1.73_m2} (ref >59)
GFR calc non Af Amer: 92 mL/min/{1.73_m2} (ref >59)
Glucose: 98 mg/dL (ref 65–99)
Potassium: 5.1 mmol/L (ref 3.5–5.2)
Sodium: 140 mmol/L (ref 134–144)

## 2020-05-04 ENCOUNTER — Telehealth (HOSPITAL_COMMUNITY): Payer: Self-pay | Admitting: *Deleted

## 2020-05-04 NOTE — Telephone Encounter (Signed)
Reaching out to patient to offer assistance regarding upcoming cardiac imaging study; pt's wife verbalizes understanding of appt date/time, parking situation and where to check in, pre-test NPO status; name and call back number provided for further questions should they arise  Burley Saver RN [place] [facility] [phone] office [phone] cell

## 2020-05-04 NOTE — Telephone Encounter (Signed)
Pt returning call regarding upcoming cardiac imaging study; pt verbalizes understanding of appt date/time, parking situation and where to check in, pre-test NPO status and medications ordered, and verified current allergies; name and call back number provided for further questions should they arise  Kavon Valenza Tai RN Navigator Cardiac Imaging [facility] [phone] office [phone] cell  

## 2020-05-05 ENCOUNTER — Ambulatory Visit (HOSPITAL_COMMUNITY)
Admission: RE | Admit: 2020-05-05 | Discharge: 2020-05-05 | Disposition: A | Discharge status: Home / Self Care | Payer: Medicare Other | Source: Ambulatory Visit | Attending: Cardiology | Admitting: Cardiology

## 2020-05-05 DIAGNOSIS — R079 Chest pain, unspecified: Secondary | ICD-10-CM

## 2020-05-05 DIAGNOSIS — R0789 Other chest pain: Secondary | ICD-10-CM | POA: Diagnosis present

## 2020-05-05 DIAGNOSIS — I517 Cardiomegaly: Secondary | ICD-10-CM | POA: Diagnosis not present

## 2020-05-05 MED ORDER — NITROGLYCERIN 0.4 MG SL SUBL
0.4000 mg | SUBLINGUAL_TABLET | Freq: Once | SUBLINGUAL | Status: AC
  Administered 2020-05-05: 0.4 mg via SUBLINGUAL

## 2020-05-05 MED ORDER — NITROGLYCERIN 0.4 MG SL SUBL
SUBLINGUAL_TABLET | SUBLINGUAL | Status: AC
  Filled 2020-05-05: qty 1

## 2020-05-05 MED ORDER — IOHEXOL 350 MG/ML SOLN
90.0000 mL | Freq: Once | INTRAVENOUS | Status: AC | PRN
  Administered 2020-05-05: 90 mL via INTRAVENOUS

## 2020-05-06 ENCOUNTER — Telehealth: Payer: Self-pay | Admitting: *Deleted

## 2020-05-06 DIAGNOSIS — E782 Mixed hyperlipidemia: Secondary | ICD-10-CM

## 2020-05-06 DIAGNOSIS — I471 Supraventricular tachycardia: Secondary | ICD-10-CM

## 2020-05-06 DIAGNOSIS — R0789 Other chest pain: Secondary | ICD-10-CM

## 2020-05-06 NOTE — Telephone Encounter (Signed)
lmtcb

## 2020-05-06 NOTE — Telephone Encounter (Signed)
-----   Message from Will Meredith Leeds, MD sent at 05/06/2020  7:39 AM EDT ----- Nonobstructive CAD noted. Needs fasting lipids with new LDL goal and will likely need increased statin dose.

## 2020-05-09 NOTE — Telephone Encounter (Signed)
ROUTED TO PRE OP IN ERROR; PRE OP PROVIDER PLEAS DISREGARD; CALL FORWARDED TO TRIAGE

## 2020-05-09 NOTE — Telephone Encounter (Signed)
Follow up   Pt is returning call    

## 2020-05-09 NOTE — Telephone Encounter (Signed)
Spoke with the patient and made him aware of CT scan results. Patient verbalized understanding. He will come in on Wednesday for fasting lipids.

## 2020-05-11 ENCOUNTER — Other Ambulatory Visit: Payer: Medicare Other | Admitting: *Deleted

## 2020-05-11 ENCOUNTER — Other Ambulatory Visit: Payer: Self-pay

## 2020-05-11 DIAGNOSIS — E782 Mixed hyperlipidemia: Secondary | ICD-10-CM

## 2020-05-11 LAB — LIPID PANEL
Chol/HDL Ratio: 2.8 ratio (ref 0.0–5.0)
Cholesterol, Total: 122 mg/dL (ref 100–199)
HDL: 44 mg/dL (ref >39)
LDL Chol Calc (NIH): 59 mg/dL (ref 0–99)
Triglycerides: 104 mg/dL (ref 0–149)
VLDL Cholesterol Cal: 19 mg/dL (ref 5–40)

## 2020-07-26 ENCOUNTER — Ambulatory Visit: Payer: Medicare Other | Admitting: Cardiology

## 2020-07-26 ENCOUNTER — Encounter: Payer: Self-pay | Admitting: Cardiology

## 2020-07-26 ENCOUNTER — Other Ambulatory Visit: Payer: Self-pay

## 2020-07-26 VITALS — BP 92/52 | HR 52 | Ht 66.0 in | Wt 261.2 lb

## 2020-07-26 DIAGNOSIS — I495 Sick sinus syndrome: Secondary | ICD-10-CM

## 2020-07-26 NOTE — Progress Notes (Signed)
Electrophysiology Office Note   Date:  07/26/2020   ID:  Danny Lee, DOB 09/22/1946, MRN [id number]  PCP:  Antony Contras, MD  Cardiologist:  Radford Pax Primary Electrophysiologist:  Constance Haw, MD    No chief complaint on file.    History of Present Illness: Danny Lee is a 74 y.o. male who presents today for electrophysiology evaluation.   He has a history of hypertension, hyperlipidemia, OSA, obesity, prostate cancer, asthma, and sinus bradycardia.  He presented for evaluation of bradycardia for possible pacemaker implant.  He wore a cardiac monitor that showed an average heart rate of 66 bpm.    Today, denies symptoms of palpitations, chest pain, shortness of breath, orthopnea, PND, lower extremity edema, claudication, dizziness, presyncope, syncope, bleeding, or neurologic sequela. The patient is tolerating medications without difficulties.  Since last being seen he has done well.  He continues to have short episodes of fatigue, though he is otherwise without complaint.  He has not had any more chest pain.    Past Medical History:  Diagnosis Date  . Allergic rhinitis   . Asthma    mild  . BMI 40.0-44.9, adult ([place])   . Dyspnea    a. Prior eval in 2009 nl. b.   . ED (erectile dysfunction)   . Esophageal dysphagia   . Gout    rare  . Hearing aid worn    Bilateral  . Hx of colonic polyps   . Hypercholesteremia   . Hypertension   . Obesity   . OSA (obstructive sleep apnea)    SPLIT 06/16/13 ESS 10,AHI 36/hr REM 71/hr, RDI same, O2 minn 68%, CPAP 12 w AHI 2, APAP recommeded  . PAT (paroxysmal atrial tachycardia) ([place])    a. Brief nonsustained atrial tach on event monitor in 2015 - 4-6 beats.  . Prediabetes   . Premature atrial contractions    a. By event monitor 2015.  [name] Prostate cancer ([place]) 2010  . Sinus bradycardia    a. HR 45 in 2015.  . Tobacco abuse    Past Surgical History:  Procedure Laterality Date  . CHOLECYSTECTOMY    . HEEL SPUR  SURGERY    . HEMICOLECTOMY Right    and Lysis of adhesions  . KNEE ARTHROSCOPY Left   . PROSTATE SURGERY    . PROSTATECTOMY  02/2011     Current Outpatient Medications  Medication Sig Dispense Refill  . atorvastatin (LIPITOR) 40 MG tablet Take 40 mg by mouth every morning.    [name] losartan (COZAAR) 100 MG tablet Take 1 tablet by mouth every morning.    . Multiple Vitamin (MULITIVITAMIN WITH MINERALS) TABS Take 1 tablet by mouth daily.     No current facility-administered medications for this visit.    Allergies:   Amlodipine besylate and Aspirin   Social History:  The patient  reports that he has been smoking cigars. He uses smokeless tobacco. He reports that he does not drink alcohol and does not use drugs.   Family History:  The patient's family history includes CAD in his brother; Heart attack in his brother; Hypertension in his brother.   ROS:  Please see the history of present illness.   Otherwise, review of systems is positive for none.   All other systems are reviewed and negative.   PHYSICAL EXAM: VS:  BP (!) 92/52   Pulse (!) 52   Ht 5\' 6"  (1.676 m)   Wt 261 lb 3.2 oz (118.5 kg)  SpO2 96%   BMI 42.16 kg/m  , BMI Body mass index is 42.16 kg/m. GEN: Well nourished, well developed, in no acute distress  HEENT: normal  Neck: no JVD, carotid bruits, or masses Cardiac: RRR; no murmurs, rubs, or gallops,no edema  Respiratory:  clear to auscultation bilaterally, normal work of breathing GI: soft, nontender, nondistended, + BS MS: no deformity or atrophy  Skin: warm and dry Neuro:  Strength and sensation are intact Psych: euthymic mood, full affect  EKG:  EKG is not ordered today. Personal review of the ekg ordered 04/07/20 shows sinus rhythm, rate 57   Recent Labs: 05/03/2020: BUN 11; Creatinine, Ser 0.72; Potassium 5.1; Sodium 140    Lipid Panel     Component Value Date/Time   CHOL 122 05/11/2020 0836   TRIG 104 05/11/2020 0836   HDL 44 05/11/2020 0836    CHOLHDL 2.8 05/11/2020 0836   LDLCALC 59 05/11/2020 0836     Wt Readings from Last 3 Encounters:  07/26/20 261 lb 3.2 oz (118.5 kg)  04/07/20 254 lb (115.2 kg)  05/09/16 253 lb 9.6 oz (115 kg)      Other studies Reviewed: Additional studies/ records that were reviewed today include:  TTE 2015 - Normal LV size and systolic function, EF 16%. Moderate diastolic dysfunction. Normal RV size and systolic function. No significant valvular abnormalities.  Cardiac monitor 05/23/2020 personally reviewed Max 176 bpm 05:31pm, 07/18 Min 36 bpm 05:51am, 07/28 Avg 66 bpm 5.3% PACs, <1% PVCs Multiple SVT events noted, longest 20 beats at 100 bpm, fastest 8 beats at 176 6 bpm Predominant rhythm is sinus rhythm No symptoms are triggered episodes recorded   ETT 05/03/16   Blood pressure demonstrated a normal response to exercise.  There was no ST segment deviation noted during stress.  Exercise time 7 minutes and 20 seconds-good  Low risk exercise treadmill test with no electrocardiographic evidence of ischemia   ASSESSMENT AND PLAN:  1.  Sinus bradycardia: Wore a cardiac monitor that showed an average heart rate of 66 bpm.  His low heart rate was 36 bpm but this was nocturnal appearing.  He continues to have issues of fatigue, but with his heart rate being normal, I do not feel that he would benefit from pacemaker implant.  2.  Hypertension: Currently well controlled  3.  Obstructive sleep apnea: CPAP compliance encouraged  4.  Chest pain: CT without hemodynamically limiting stenosis.  Likely noncardiac.  Current medicines are reviewed at length with the patient today.   The patient does not have concerns regarding his medicines.  The following changes were made today: None  Labs/ tests ordered today include:  No orders of the defined types were placed in this encounter.    Disposition:   FU with Danny Lee 12 months  Signed, Kooper Chriswell Meredith Leeds, MD  07/26/2020 9:44 AM      [facility] [address] [place] [address] [phone] (office) [phone] (fax)

## 2020-07-26 NOTE — Patient Instructions (Signed)
Medication Instructions:  Your physician recommends that you continue on your current medications as directed. Please refer to the Current Medication list given to you today.  *If you need a refill on your cardiac medications before your next appointment, please call your pharmacy*   Lab Work: None ordered   Testing/Procedures: None ordered   Follow-Up: At [facility], you and your health needs are our priority.  As part of our continuing mission to provide you with exceptional heart care, we have created designated Provider Care Teams.  These Care Teams include your primary Cardiologist (physician) and Advanced Practice Providers (APPs -  Physician Assistants and Nurse Practitioners) who all work together to provide you with the care you need, when you need it.  We recommend signing up for the patient portal called "MyChart".  Sign up information is provided on this After Visit Summary.  MyChart is used to connect with patients for Virtual Visits (Telemedicine).  Patients are able to view lab/test results, encounter notes, upcoming appointments, etc.  Non-urgent messages can be sent to your provider as well.   To learn more about what you can do with MyChart, go to [email or web address].    Your next appointment:   1 year(s)  The format for your next appointment:   In Person  Provider:   Will Camnitz, MD    Thank you for choosing [facility]!!   Adean Milosevic, RN [phone]    

## 2020-10-11 DIAGNOSIS — M9903 Segmental and somatic dysfunction of lumbar region: Secondary | ICD-10-CM | POA: Diagnosis not present

## 2020-10-11 DIAGNOSIS — M5136 Other intervertebral disc degeneration, lumbar region: Secondary | ICD-10-CM | POA: Diagnosis not present

## 2020-10-11 DIAGNOSIS — M9904 Segmental and somatic dysfunction of sacral region: Secondary | ICD-10-CM | POA: Diagnosis not present

## 2020-10-11 DIAGNOSIS — M9905 Segmental and somatic dysfunction of pelvic region: Secondary | ICD-10-CM | POA: Diagnosis not present

## 2020-10-12 DIAGNOSIS — G4733 Obstructive sleep apnea (adult) (pediatric): Secondary | ICD-10-CM | POA: Diagnosis not present

## 2020-10-19 DIAGNOSIS — G4733 Obstructive sleep apnea (adult) (pediatric): Secondary | ICD-10-CM | POA: Diagnosis not present

## 2021-03-09 DIAGNOSIS — I10 Essential (primary) hypertension: Secondary | ICD-10-CM | POA: Diagnosis not present

## 2021-03-09 DIAGNOSIS — G4730 Sleep apnea, unspecified: Secondary | ICD-10-CM | POA: Diagnosis not present

## 2021-03-09 DIAGNOSIS — R001 Bradycardia, unspecified: Secondary | ICD-10-CM | POA: Diagnosis not present

## 2021-03-09 DIAGNOSIS — E782 Mixed hyperlipidemia: Secondary | ICD-10-CM | POA: Diagnosis not present

## 2021-03-09 DIAGNOSIS — H9190 Unspecified hearing loss, unspecified ear: Secondary | ICD-10-CM | POA: Diagnosis not present

## 2021-03-09 DIAGNOSIS — R7303 Prediabetes: Secondary | ICD-10-CM | POA: Diagnosis not present

## 2021-03-09 DIAGNOSIS — J309 Allergic rhinitis, unspecified: Secondary | ICD-10-CM | POA: Diagnosis not present

## 2021-03-09 DIAGNOSIS — M109 Gout, unspecified: Secondary | ICD-10-CM | POA: Diagnosis not present

## 2021-03-15 DIAGNOSIS — H25813 Combined forms of age-related cataract, bilateral: Secondary | ICD-10-CM | POA: Diagnosis not present

## 2021-03-15 DIAGNOSIS — D3132 Benign neoplasm of left choroid: Secondary | ICD-10-CM | POA: Diagnosis not present

## 2021-03-15 DIAGNOSIS — H04123 Dry eye syndrome of bilateral lacrimal glands: Secondary | ICD-10-CM | POA: Diagnosis not present

## 2021-03-15 DIAGNOSIS — H02834 Dermatochalasis of left upper eyelid: Secondary | ICD-10-CM | POA: Diagnosis not present

## 2021-03-15 DIAGNOSIS — H02831 Dermatochalasis of right upper eyelid: Secondary | ICD-10-CM | POA: Diagnosis not present

## 2021-03-15 DIAGNOSIS — H43812 Vitreous degeneration, left eye: Secondary | ICD-10-CM | POA: Diagnosis not present

## 2021-05-02 DIAGNOSIS — G4733 Obstructive sleep apnea (adult) (pediatric): Secondary | ICD-10-CM | POA: Diagnosis not present

## 2021-05-18 DIAGNOSIS — H2512 Age-related nuclear cataract, left eye: Secondary | ICD-10-CM | POA: Diagnosis not present

## 2021-06-21 DIAGNOSIS — Z23 Encounter for immunization: Secondary | ICD-10-CM | POA: Diagnosis not present

## 2021-07-16 DIAGNOSIS — H2511 Age-related nuclear cataract, right eye: Secondary | ICD-10-CM | POA: Diagnosis not present

## 2021-07-20 DIAGNOSIS — H2511 Age-related nuclear cataract, right eye: Secondary | ICD-10-CM | POA: Diagnosis not present

## 2021-07-26 DIAGNOSIS — H318 Other specified disorders of choroid: Secondary | ICD-10-CM | POA: Diagnosis not present

## 2021-07-26 DIAGNOSIS — H2189 Other specified disorders of iris and ciliary body: Secondary | ICD-10-CM | POA: Diagnosis not present

## 2021-09-15 DIAGNOSIS — G4730 Sleep apnea, unspecified: Secondary | ICD-10-CM | POA: Diagnosis not present

## 2021-09-15 DIAGNOSIS — M109 Gout, unspecified: Secondary | ICD-10-CM | POA: Diagnosis not present

## 2021-09-15 DIAGNOSIS — R7303 Prediabetes: Secondary | ICD-10-CM | POA: Diagnosis not present

## 2021-09-15 DIAGNOSIS — Z1389 Encounter for screening for other disorder: Secondary | ICD-10-CM | POA: Diagnosis not present

## 2021-09-15 DIAGNOSIS — Z1211 Encounter for screening for malignant neoplasm of colon: Secondary | ICD-10-CM | POA: Diagnosis not present

## 2021-09-15 DIAGNOSIS — E782 Mixed hyperlipidemia: Secondary | ICD-10-CM | POA: Diagnosis not present

## 2021-09-15 DIAGNOSIS — I10 Essential (primary) hypertension: Secondary | ICD-10-CM | POA: Diagnosis not present

## 2021-09-15 DIAGNOSIS — Z0000 Encounter for general adult medical examination without abnormal findings: Secondary | ICD-10-CM | POA: Diagnosis not present

## 2021-09-15 DIAGNOSIS — I495 Sick sinus syndrome: Secondary | ICD-10-CM | POA: Diagnosis not present

## 2021-09-19 ENCOUNTER — Telehealth: Payer: Self-pay

## 2021-09-19 NOTE — Telephone Encounter (Signed)
SCANNED NOTES TO REFERRAL

## 2021-09-26 ENCOUNTER — Other Ambulatory Visit: Payer: Self-pay

## 2021-09-26 ENCOUNTER — Encounter: Payer: Self-pay | Admitting: Cardiology

## 2021-09-26 ENCOUNTER — Ambulatory Visit: Payer: Medicare Other | Admitting: Cardiology

## 2021-09-26 VITALS — BP 126/70 | HR 48 | Ht 66.0 in | Wt 250.0 lb

## 2021-09-26 DIAGNOSIS — R079 Chest pain, unspecified: Secondary | ICD-10-CM

## 2021-09-26 DIAGNOSIS — I495 Sick sinus syndrome: Secondary | ICD-10-CM

## 2021-09-26 NOTE — Progress Notes (Signed)
Electrophysiology Office Note   Date:  09/26/2021   ID:  Barkley, [place] 03-28-1946, MRN [id number]  PCP:  Antony Contras, MD  Cardiologist:  Radford Pax Primary Electrophysiologist:  Britanee Vanblarcom Meredith Leeds, MD    No chief complaint on file.    History of Present Illness: Danny Lee is a 75 y.o. male who presents today for electrophysiology evaluation.     He has a history significant for hypertension, hyperlipidemia, obstructive sleep apnea, obesity, prostate cancer, asthma, sinus bradycardia.  He wore a cardiac monitor 05/23/2020 which showed an average heart rate of 66 bpm.  He was bradycardic at 36 bpm, though this was not nocturnal.  Today, denies symptoms of palpitations, chest pain, shortness of breath, orthopnea, PND, lower extremity edema, claudication, dizziness, presyncope, syncope, bleeding, or neurologic sequela. The patient is tolerating medications without difficulties.  Today he feels well.  He has no shortness of breath.  He has no chest pain today, but a few days ago, he had pain in his right shoulder that radiated down his arm.  He has never had a pain like this before.  He does have pain in his chest that lasts a few seconds at a time.  He states that his arm was a little bit weak, this lasted approximately a minute and a half.  No other exacerbating or alleviating factors.   Past Medical History:  Diagnosis Date   Allergic rhinitis    Asthma    mild   BMI 40.0-44.9, adult ([facility])    Dyspnea    a. Prior eval in 2009 nl. b.    ED (erectile dysfunction)    Esophageal dysphagia    Gout    rare   Hearing aid worn    Bilateral   Hx of colonic polyps    Hypercholesteremia    Hypertension    Obesity    OSA (obstructive sleep apnea)    SPLIT 06/16/13 ESS 10,AHI 36/hr REM 71/hr, RDI same, O2 minn 68%, CPAP 12 w AHI 2, APAP recommeded   PAT (paroxysmal atrial tachycardia) ([place])    a. Brief nonsustained atrial tach on event monitor in 2015 - 4-6 beats.    Prediabetes    Premature atrial contractions    a. By event monitor 2015.   Prostate cancer ([place]) 2010   Sinus bradycardia    a. HR 45 in 2015.   Tobacco abuse    Past Surgical History:  Procedure Laterality Date   CHOLECYSTECTOMY     HEEL SPUR SURGERY     HEMICOLECTOMY Right    and Lysis of adhesions   KNEE ARTHROSCOPY Left    PROSTATE SURGERY     PROSTATECTOMY  02/2011     Current Outpatient Medications  Medication Sig Dispense Refill   atorvastatin (LIPITOR) 40 MG tablet Take 40 mg by mouth every morning.     losartan (COZAAR) 100 MG tablet Take 1 tablet by mouth every morning.     Multiple Vitamin (MULITIVITAMIN WITH MINERALS) TABS Take 1 tablet by mouth daily.     No current facility-administered medications for this visit.    Allergies:   Amlodipine besylate and Aspirin   Social History:  The patient  reports that he has been smoking cigars. He uses smokeless tobacco. He reports that he does not drink alcohol and does not use drugs.   Family History:  The patient's family history includes CAD in his brother; Heart attack in his brother; Hypertension in his brother.   ROS:  Please see the history of present illness.   Otherwise, review of systems is positive for none.   All other systems are reviewed and negative.   PHYSICAL EXAM: VS:  BP 126/70    Pulse (!) 48    Ht 5\' 6"  (1.676 m)    Wt 250 lb (113.4 kg)    SpO2 95%    BMI 40.35 kg/m  , BMI Body mass index is 40.35 kg/m. GEN: Well nourished, well developed, in no acute distress  HEENT: normal  Neck: no JVD, carotid bruits, or masses Cardiac: RRR; no murmurs, rubs, or gallops,no edema  Respiratory:  clear to auscultation bilaterally, normal work of breathing GI: soft, nontender, nondistended, + BS MS: no deformity or atrophy  Skin: warm and dry Neuro:  Strength and sensation are intact Psych: euthymic mood, full affect  EKG:  EKG is ordered today. Personal review of the ekg ordered shows sinus rhythm, rate  48   Recent Labs: No results found for requested labs within last 8760 hours.    Lipid Panel     Component Value Date/Time   CHOL 122 05/11/2020 0836   TRIG 104 05/11/2020 0836   HDL 44 05/11/2020 0836   CHOLHDL 2.8 05/11/2020 0836   LDLCALC 59 05/11/2020 0836     Wt Readings from Last 3 Encounters:  09/26/21 250 lb (113.4 kg)  07/26/20 261 lb 3.2 oz (118.5 kg)  04/07/20 254 lb (115.2 kg)      Other studies Reviewed: Additional studies/ records that were reviewed today include:  TTE 2015 - Normal LV size and systolic function, EF 16%. Moderate diastolic   dysfunction. Normal RV size and systolic function. No significant   valvular abnormalities.  Cardiac monitor 05/23/2020 personally reviewed Max 176 bpm 05:31pm, 07/18 Min 36 bpm 05:51am, 07/28 Avg 66 bpm 5.3% PACs, <1% PVCs Multiple SVT events noted, longest 20 beats at 100 bpm, fastest 8 beats at 176 6 bpm Predominant rhythm is sinus rhythm No symptoms are triggered episodes recorded   ETT 05/03/16  Blood pressure demonstrated a normal response to exercise. There was no ST segment deviation noted during stress. Exercise time 7 minutes and 20 seconds-good Low risk exercise treadmill test with no electrocardiographic evidence of ischemia   ASSESSMENT AND PLAN:  1.  Sick sinus syndrome: He is bradycardic in clinic today.  He states that his heart rates are in the 40s to 60s when he is at his doctor's appointments.  He does have weakness and fatigue, though he is unsure as to whether or not he wants a pacemaker implanted.  He Kynli Chou discuss this further with his wife and let us know.  2.  Hypertension: Currently well controlled  3.  Obstructive sleep apnea: CPAP compliance encouraged  4.  Chest pain: Unclear as to the cause of his chest pain.  He to have concerning symptoms.   He had a perfusion scan in 2015 which was low risk.  Despite that, his chest pain has continued.  We Melvine Julin plan for coronary CTA.  Current  medicines are reviewed at length with the patient today.   The patient does not have concerns regarding his medicines.  The following changes were made today: None  Labs/ tests ordered today include:  Orders Placed This Encounter  Procedures   CT CORONARY MORPH W/CTA COR W/SCORE W/CA W/CM &/OR WO/CM   Basic metabolic panel   EKG 96-VELF     Disposition:   FU with Keiera Strathman pending CT months  Signed,  Arnesha Schiraldi Meredith Leeds, MD  09/26/2021 12:29 PM     [facility] [address] [place] [address] [phone] (office) [phone] (fax)

## 2021-09-26 NOTE — Patient Instructions (Addendum)
Medication Instructions:  Your physician recommends that you continue on your current medications as directed. Please refer to the Current Medication list given to you today.  *If you need a refill on your cardiac medications before your next appointment, please call your pharmacy*   Lab Work: BMET today   Testing/Procedures: Your physician has requested that you have cardiac CT. Cardiac computed tomography (CT) is a painless test that uses an x-ray machine to take clear, detailed pictures of your heart. For further information please visit [email or web address]. Please follow instruction sheet as given.    Follow-Up: At [facility], you and your health needs are our priority.  As part of our continuing mission to provide you with exceptional heart care, we have created designated Provider Care Teams.  These Care Teams include your primary Cardiologist (physician) and Advanced Practice Providers (APPs -  Physician Assistants and Nurse Practitioners) who all work together to provide you with the care you need, when you need it.  We recommend signing up for the patient portal called "MyChart".  Sign up information is provided on this After Visit Summary.  MyChart is used to connect with patients for Virtual Visits (Telemedicine).  Patients are able to view lab/test results, encounter notes, upcoming appointments, etc.  Non-urgent messages can be sent to your provider as well.   To learn more about what you can do with MyChart, go to [email or web address].    Your next appointment:   To be  determined after CT  The format for your next appointment:   In Person  Provider:   Allegra Lai, MD    Thank you for choosing [facility]!!   Trinidad Curet, RN [phone]   Other Instructions CT INSTRUCTIONS Your cardiac CT will be scheduled at:  [facility] [address] [place], [place] [address] [phone]  Please arrive at the [facility] main entrance of  [facility] 30 minutes prior to test start time. Proceed to the [facility] Radiology Department (first floor) to check-in and test prep.   Please follow these instructions carefully (unless otherwise directed):  Hold all erectile dysfunction medications at least 3 days (72 hrs) prior to test.  On the Night Before the Test: Be sure to Drink plenty of water. Do not consume any caffeinated/decaffeinated beverages or chocolate 12 hours prior to your test. Do not take any antihistamines 12 hours prior to your test.  On the Day of the Test: Drink plenty of water until 1 hour prior to the test. Do not eat any food 4 hours prior to the test. You may take your regular medications prior to the test.  HOLD Furosemide/Hydrochlorothiazide morning of the test.       After the Test: Drink plenty of water. After receiving IV contrast, you may experience a mild flushed feeling. This is normal. On occasion, you may experience a mild rash up to 24 hours after the test. This is not dangerous. If this occurs, you can take Benadryl 25 mg and increase your fluid intake. If you experience trouble breathing, this can be serious. If it is severe call 911 IMMEDIATELY. If it is mild, please call our office. If you take any of these medications: Glipizide/Metformin, Avandament, Glucavance, please do not take 48 hours after completing test unless otherwise instructed.   Once we have confirmed authorization from your insurance company, we will call you to set up a date and time for your test. Based on how quickly your insurance processes prior authorizations  requests, please allow up to 4 weeks to be contacted for scheduling your Cardiac CT appointment. Be advised that routine Cardiac CT appointments could be scheduled as many as 8 weeks after your provider has ordered it.  For non-scheduling related questions, please contact the cardiac imaging nurse navigator should you have any questions/concerns: Marchia Bond, Cardiac Imaging Nurse Navigator Gordy Clement, Cardiac Imaging Nurse Navigator [facility] Direct Office Dial: [phone]   For scheduling needs, including cancellations and rescheduling, please call [place], [phone].

## 2021-09-27 LAB — BASIC METABOLIC PANEL
BUN/Creatinine Ratio: 14 (ref 10–24)
BUN: 11 mg/dL (ref 8–27)
CO2: 28 mmol/L (ref 20–29)
Calcium: 9.4 mg/dL (ref 8.6–10.2)
Chloride: 107 mmol/L — ABNORMAL HIGH (ref 96–106)
Creatinine, Ser: 0.78 mg/dL (ref 0.76–1.27)
Glucose: 95 mg/dL (ref 70–99)
Potassium: 5.1 mmol/L (ref 3.5–5.2)
Sodium: 145 mmol/L — ABNORMAL HIGH (ref 134–144)
eGFR: 93 mL/min/{1.73_m2} (ref >59)

## 2021-10-05 DIAGNOSIS — G4733 Obstructive sleep apnea (adult) (pediatric): Secondary | ICD-10-CM | POA: Diagnosis not present

## 2021-10-11 ENCOUNTER — Telehealth (HOSPITAL_COMMUNITY): Payer: Self-pay | Admitting: Emergency Medicine

## 2021-10-11 NOTE — Telephone Encounter (Signed)
Reaching out to patient to offer assistance regarding upcoming cardiac imaging study; pt verbalizes understanding of appt date/time, parking situation and where to check in, pre-test NPO status and medications ordered, and verified current allergies; name and call back number provided for further questions should they arise Marchia Bond RN Navigator Cardiac Imaging [facility] [phone] office [phone] cell  Denies iv issues Daily meds Arrival 7:15a

## 2021-10-13 ENCOUNTER — Ambulatory Visit (HOSPITAL_COMMUNITY)
Admission: RE | Admit: 2021-10-13 | Discharge: 2021-10-13 | Disposition: A | Discharge status: Home / Self Care | Payer: Medicare Other | Source: Ambulatory Visit | Attending: Cardiology | Admitting: Cardiology

## 2021-10-13 ENCOUNTER — Other Ambulatory Visit: Payer: Self-pay | Admitting: Cardiology

## 2021-10-13 ENCOUNTER — Other Ambulatory Visit: Payer: Self-pay

## 2021-10-13 DIAGNOSIS — R931 Abnormal findings on diagnostic imaging of heart and coronary circulation: Secondary | ICD-10-CM

## 2021-10-13 DIAGNOSIS — R079 Chest pain, unspecified: Secondary | ICD-10-CM | POA: Insufficient documentation

## 2021-10-13 DIAGNOSIS — I2510 Atherosclerotic heart disease of native coronary artery without angina pectoris: Secondary | ICD-10-CM | POA: Diagnosis not present

## 2021-10-13 DIAGNOSIS — R0789 Other chest pain: Secondary | ICD-10-CM | POA: Insufficient documentation

## 2021-10-13 IMAGING — CT CT HEART MORP W/ CTA COR W/ SCORE W/ CA W/CM &/OR W/O CM
4 of 7 series · 8 of 20 positions shown, 9 images · non-contrast
Comparison: Prior study on 05/05/2020
COMPARISON: Prior study on 05/05/2020

Addendum:
EXAM:
OVER-READ INTERPRETATION  CT CHEST

The following report is an over-read performed by radiologist Dr.
over-read does not include interpretation of cardiac or coronary
anatomy or pathology. The cardiac CTA interpretation by the
cardiologist is attached.
CLINICAL DATA: 75 year old with chest pain, bradycardia
Cardiac/Coronary  CTA
TECHNIQUE: The patient was scanned on a [employer] Force scanner.

[Series 6: ts diast · axial · 0.41mm/px · z∈[-145,-107]mm · 2 of 286 slices shown, 3 images]
[im 96/286  vessel]
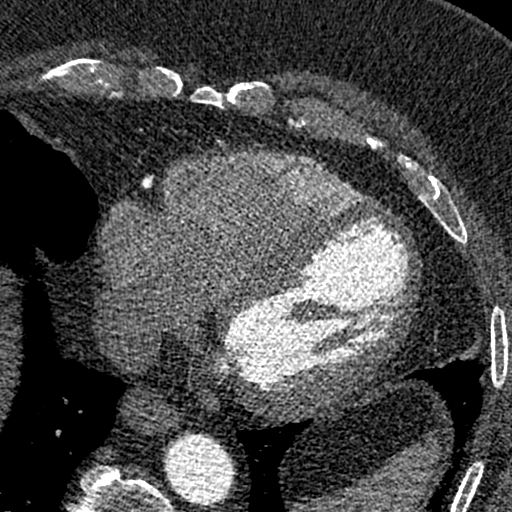
[im 96/286  lung]
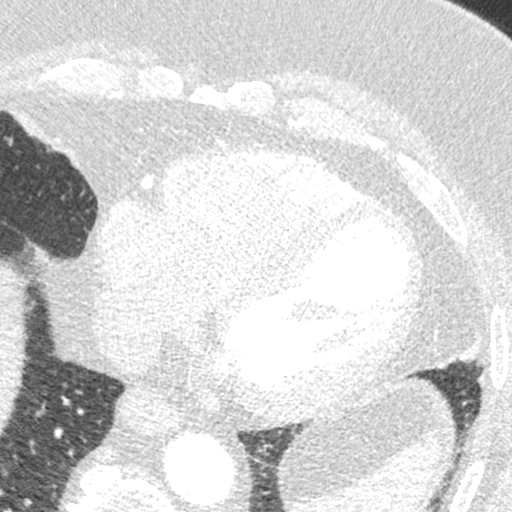
[im 191/286  vessel]
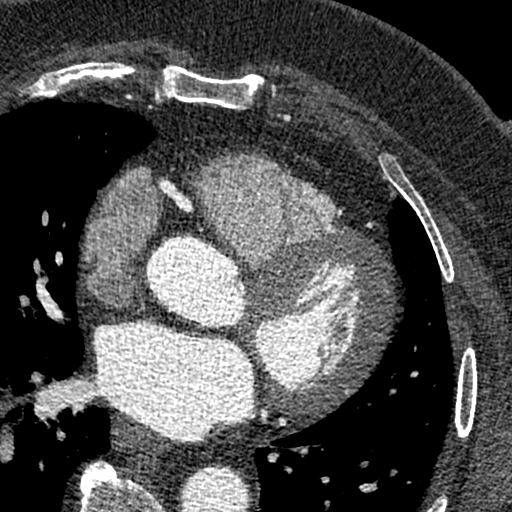

[Series 7: ts syst · axial · 0.41mm/px · z∈[-145,-107]mm · 2 of 286 slices shown]
[im 96/286  vessel]
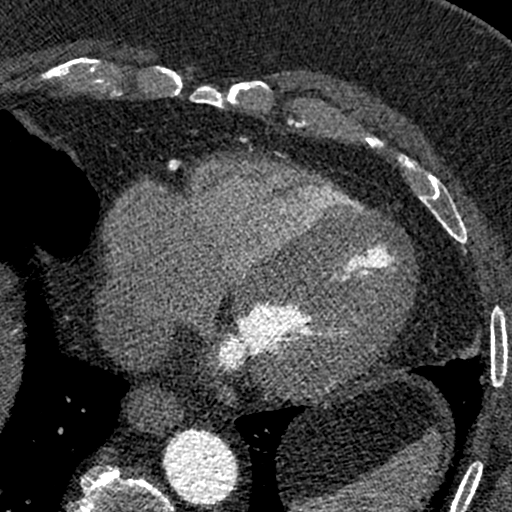
[im 191/286  vessel]
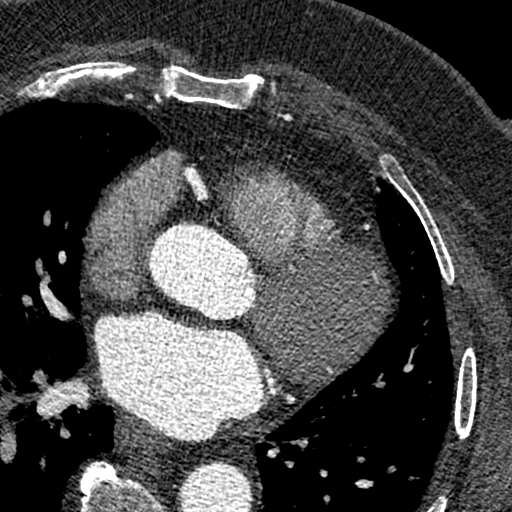

[Series 8: best diast · axial · 0.41mm/px · z∈[-145,-107]mm · 2 of 286 slices shown]
[im 96/286  vessel]
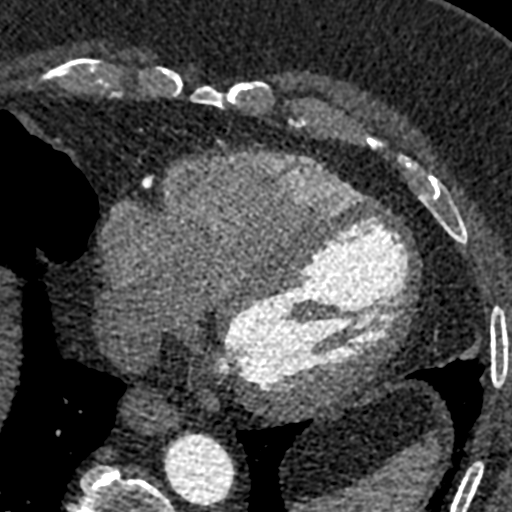
[im 191/286  vessel]
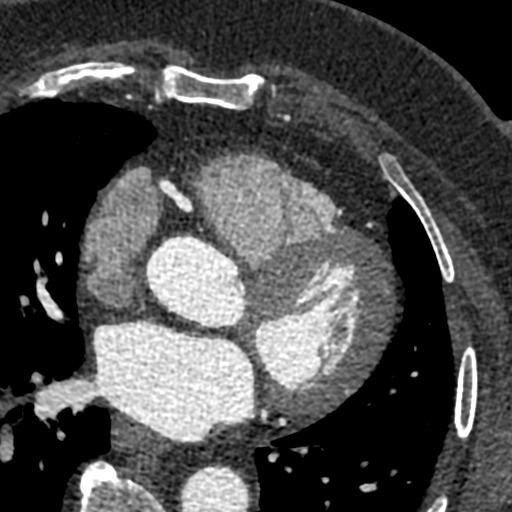

[Series 9: best syst · axial · 0.41mm/px · z∈[-145,-107]mm · 2 of 286 slices shown]
[im 96/286  vessel]
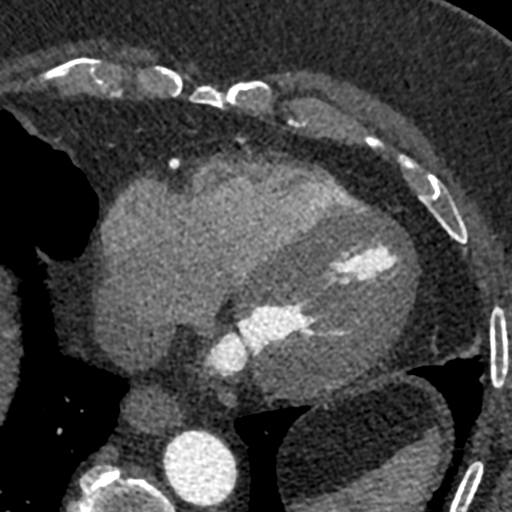
[im 191/286  vessel]
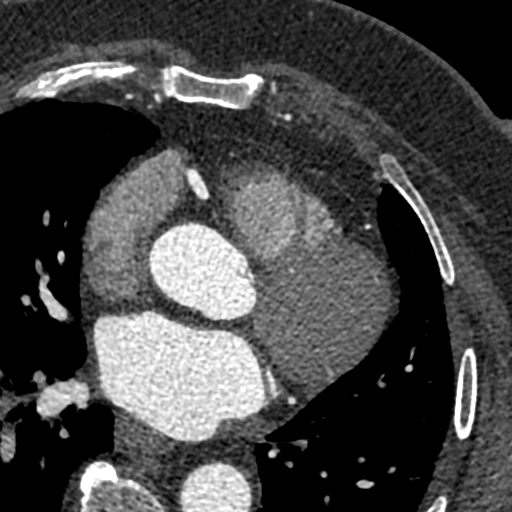

[8 of 20 positions shown; findings below may reference images not displayed]

FINDINGS: Vascular: No significant noncardiac vascular findings.

Mediastinum/Nodes: Visualized mediastinum and hilar regions
demonstrate no lymphadenopathy or masses.

Lungs/Pleura: Visualized lungs show no evidence of pulmonary edema,
consolidation, pneumothorax, nodule or pleural fluid.

Upper Abdomen: No acute abnormality.

Musculoskeletal: No chest wall mass or suspicious bone lesions
identified.
IMPRESSION: No significant incidental findings.
FINDINGS: A 120 kV prospective scan was triggered in the descending thoracic
aorta at 111 HU's. Axial non-contrast 3 mm slices were carried out
through the heart. The data set was analyzed on a dedicated work
station and scored using the Agatson method. Gantry rotation speed
was 250 msecs and collimation was .6 mm. 0.8 mg of sl NTG was given.
The 3D data set was reconstructed in 5% intervals of the 67-82 % of
the R-R cycle. Diastolic phases were analyzed on a dedicated work
station using MPR, MIP and VRT modes. The patient received 80 cc of
contrast.

Image quality: good

Aorta:  Normal size, 33 mm.  No calcifications.  No dissection.

Aortic Valve:  Trileaflet.  Mild leaflet calcification.

Coronary Arteries:  Normal coronary origin.  Right dominance.

RCA is a large dominant artery that gives rise to PDA and PLA. There
is focal mid calcified plaque 0 to 24% stenosis. There is distal
calcified plaque 0 to 24% just before bifurcation of PDA. There is
scattered diffuse calcified plaque in the PDA, 0 to 24% stenosis.

Left main is a large artery that gives rise to LAD and LCX arteries.
There is calcified plaque, 0 to 24% stenosis in left main artery.

LAD is a large vessel that has long area of proximal to mid
calcified plaque 24 to 50% stenosis with calcification in the ostium
of the larger second diagonal branch 24 to 50% stenosis. FFR
analysis is normal

LCX is a non-dominant artery that gives rise to one large OM2
branch. There is focal calcification in the proximal region of large
circumflex branch. There is proximal calcification and obtuse
marginal 2. Both lesions are 0 to 24% stenosis. Other findings:

Normal pulmonary vein drainage into the left atrium.

Normal left atrial appendage without a thrombus.

Normal size of the pulmonary artery.

Small PFO

EF 63%

Please see radiology report for non cardiac findings.
IMPRESSION: 1. Coronary calcium score of 828. This was 75 percentile for age and
sex matched control.

2. Normal coronary origin with right dominance.

3. Multivessel calcified coronary artery disease with no significant
flow-limiting stenosis. FFR analysis normal in major proximal and
mid vessels.

4.  EF 63%

*** End of Addendum ***
EXAM:
OVER-READ INTERPRETATION  CT CHEST

The following report is an over-read performed by radiologist Dr.
over-read does not include interpretation of cardiac or coronary
anatomy or pathology. The cardiac CTA interpretation by the
cardiologist is attached.
FINDINGS: Vascular: No significant noncardiac vascular findings.

Mediastinum/Nodes: Visualized mediastinum and hilar regions
demonstrate no lymphadenopathy or masses.

Lungs/Pleura: Visualized lungs show no evidence of pulmonary edema,
consolidation, pneumothorax, nodule or pleural fluid.

Upper Abdomen: No acute abnormality.

Musculoskeletal: No chest wall mass or suspicious bone lesions
identified.
IMPRESSION: No significant incidental findings.

## 2021-10-13 MED ORDER — IOHEXOL 350 MG/ML SOLN
100.0000 mL | Freq: Once | INTRAVENOUS | Status: AC | PRN
  Administered 2021-10-13: 100 mL via INTRAVENOUS

## 2021-10-13 MED ORDER — NITROGLYCERIN 0.4 MG SL SUBL
0.8000 mg | SUBLINGUAL_TABLET | Freq: Once | SUBLINGUAL | Status: AC
  Administered 2021-10-13: 0.8 mg via SUBLINGUAL

## 2021-10-13 MED ORDER — NITROGLYCERIN 0.4 MG SL SUBL
SUBLINGUAL_TABLET | SUBLINGUAL | Status: AC
  Filled 2021-10-13: qty 2

## 2021-10-17 ENCOUNTER — Other Ambulatory Visit: Payer: Self-pay

## 2021-10-17 ENCOUNTER — Ambulatory Visit: Payer: Medicare Other | Admitting: Internal Medicine

## 2021-10-17 ENCOUNTER — Encounter: Payer: Self-pay | Admitting: Internal Medicine

## 2021-10-17 VITALS — BP 112/68 | HR 59 | Ht 66.0 in | Wt 251.0 lb

## 2021-10-17 DIAGNOSIS — I2510 Atherosclerotic heart disease of native coronary artery without angina pectoris: Secondary | ICD-10-CM | POA: Diagnosis not present

## 2021-10-17 DIAGNOSIS — R0609 Other forms of dyspnea: Secondary | ICD-10-CM

## 2021-10-17 DIAGNOSIS — R600 Localized edema: Secondary | ICD-10-CM | POA: Insufficient documentation

## 2021-10-17 NOTE — Progress Notes (Signed)
Cardiology Office Note:    Date:  10/17/2021   ID:  Danny Lee, DOB [birth year], MRN [id number]  PCP:  Danny Contras, MD   [facility] Providers Cardiologist:  None Electrophysiologist:  Danny Meredith Leeds, MD     Referring MD: Danny Contras, MD   DOD : Danny Lee LE edema  History of Present Illness:    Danny Lee is a 76 y.o. male with a hx of Morbid Obesity, HTN (with LE allergy to norvasc) and HLD, OSA, PAT and sinus bradycardia (PPM deferred), with former tobacco use.  At last visit found to have mild non obstructive CAD.  FFR negative.  Seen as DOD 10/17/21.    Patient notes that he is doing well.  Wife notes that he stays tired.  Says that his heart rate being low was the reason for the CT scan.  Had aching in his fingers in the past and in his shoulders but that has not bothered him since.   . There are no interval hospital/ED visit.    No chest pain or pressure.  No SOB but notes DOE and no PND/Orthopnea.  Notes weight and a bit of ankle swelling.  No palpitations or syncope.  Past Medical History:  Diagnosis Date   Allergic rhinitis    Asthma    mild   BMI 40.0-44.9, adult (HCC)    Dyspnea    a. Prior eval in 2009 nl. b.    ED (erectile dysfunction)    Esophageal dysphagia    Gout    rare   Hearing aid worn    Bilateral   Hx of colonic polyps    Hypercholesteremia    Hypertension    Obesity    OSA (obstructive sleep apnea)    SPLIT 06/16/13 ESS 10,AHI 36/hr REM 71/hr, RDI same, O2 minn 68%, CPAP 12 w AHI 2, APAP recommeded   PAT (paroxysmal atrial tachycardia) ([place])    a. Brief nonsustained atrial tach on event monitor in 2015 - 4-6 beats.   Prediabetes    Premature atrial contractions    a. By event monitor 2015.   Prostate cancer ([place]) 2010   Sinus bradycardia    a. HR 45 in 2015.   Tobacco abuse     Past Surgical History:  Procedure Laterality Date   CHOLECYSTECTOMY     HEEL SPUR SURGERY     HEMICOLECTOMY Right    and Lysis of  adhesions   KNEE ARTHROSCOPY Left    PROSTATE SURGERY     PROSTATECTOMY  02/2011    Current Medications: Current Meds  Medication Sig   atorvastatin (LIPITOR) 40 MG tablet Take 40 mg by mouth every morning.   losartan (COZAAR) 100 MG tablet Take 1 tablet by mouth every morning.   Multiple Vitamin (MULITIVITAMIN WITH MINERALS) TABS Take 1 tablet by mouth daily.     Allergies:   Amlodipine besylate and Aspirin   Social History   Socioeconomic History   Marital status: Married    Spouse name: Not on file   Number of children: Not on file   Years of education: Not on file   Highest education level: Not on file  Occupational History   Not on file  Tobacco Use   Smoking status: Some Days    Types: Cigars   Smokeless tobacco: Current  Substance and Sexual Activity   Alcohol use: No   Drug use: No   Sexual activity: Not on file  Other Topics Concern   Not  on file  Social History Narrative   Not on file   Social Determinants of Health   Financial Resource Strain: Not on file  Food Insecurity: Not on file  Transportation Needs: Not on file  Physical Activity: Not on file  Stress: Not on file  Social Connections: Not on file     Family History: The patient's family history includes CAD in his brother; Heart attack in his brother; Hypertension in his brother.  ROS:   Please see the history of present illness.     All other systems reviewed and are negative.  EKGs/Labs/Other Studies Reviewed:    The following studies were reviewed today:   Transthoracic Echocardiogram: Date: 03/31/2014 Results: Study Conclusions   - Left ventricle: The cavity size was normal. Wall thickness was    normal. The estimated ejection fraction was 55%. Although no    diagnostic regional wall motion abnormality was identified, this    possibility cannot be completely excluded on the basis of this    study. Features are consistent with a pseudonormal left    ventricular filling pattern,  with concomitant abnormal relaxation    and increased filling pressure (grade 2 diastolic dysfunction).  - Aortic valve: There was no stenosis.  - Mitral valve: Mildly calcified annulus. There was no significant    regurgitation.  - Left atrium: The atrium was mildly dilated.  - Right ventricle: The cavity size was normal. Systolic function    was normal.  - Pulmonary arteries: No complete TR doppler jet so unable to    estimate PA systolic pressure.  - Inferior vena cava: The vessel was normal in size. The    respirophasic diameter changes were in the normal range (>= 50%),    consistent with normal central venous pressure.   Cardiac: Date:10/13/21 Results: IMPRESSION: 1. Coronary calcium score of 828. This was 51 percentile for age and sex matched control.   2. Normal coronary origin with right dominance.   3. Multivessel calcified coronary artery disease with no significant flow-limiting stenosis. FFR analysis normal in major proximal and mid vessels.   4.  EF 63%   Recent Labs: 09/26/2021: BUN 11; Creatinine, Ser 0.78; Potassium 5.1; Sodium 145  Recent Lipid Panel    Component Value Date/Time   CHOL 122 05/11/2020 0836   TRIG 104 05/11/2020 0836   HDL 44 05/11/2020 0836   CHOLHDL 2.8 05/11/2020 0836   LDLCALC 59 05/11/2020 0836    Physical Exam:    VS:  BP 112/68    Pulse (!) 59    Ht 5\' 6"  (1.676 m)    Wt 113.9 kg    SpO2 96%    BMI 40.51 kg/m     Wt Readings from Last 3 Encounters:  10/17/21 113.9 kg  09/26/21 113.4 kg  07/26/20 118.5 kg     Gen: no distress, morbid obesity  Neck: No JVD, no carotid bruit Cardiac: No Rubs or Gallops, n Murmur, regular rhythm cardia, +2 radial pulses Respiratory: Clear to auscultation bilaterally, normal effort, normal  respiratory rate GI: Soft, nontender, non-distended  MS: bilateral non-pitting edema;  moves all extremities Integument: Skin feels warm Neuro:  At time of evaluation, alert and oriented to  person/place/time/situation  Psych: Normal affect, patient feels fine   ASSESSMENT:    1. Bilateral lower extremity edema   2. DOE (dyspnea on exertion)   3. Coronary artery disease involving native coronary artery of native heart without angina pectoris    PLAN:  DOE and Fatigue Bilateral non pitting edema Morbid Obesity Mild to mod non obstructive CAD - we have discussed fluid restriction and sugary food limitations at length, I suspect his his drinking multiple liters a day based on discussions - Danny check a BNP and BMP in one week - if persistently elevated Danny attempt lasix 20 mg PO q 48 hrs or more aggressive if needed - Danny repeat - discussed weight loss - rechecking cholesterol with other labs, LDL < 70 to start - reviewed Cardiac CT with patient and wife   Danny reach out to Dr. Curt Lee, Danny either establish with me as gen cards and see in 3-4 months or Danny f/u Dr. Dr. Curt Lee     Time Spent Directly with Patient:   I have spent a total of 40 minutes with the patient reviewing notes, imaging, EKGs, labs and examining the patient as well as establishing an assessment and plan that was discussed personally with the patient.  > 50% of time was spent in direct patient care and family and reviewing imaging with patient.    Medication Adjustments/Labs and Tests Ordered: Current medicines are reviewed at length with the patient today.  Concerns regarding medicines are outlined above.  Orders Placed This Encounter  Procedures   Basic metabolic panel   Pro b natriuretic peptide (BNP)   Lipid panel   ECHOCARDIOGRAM COMPLETE   No orders of the defined types were placed in this encounter.   Patient Instructions  Medication Instructions:  Your physician recommends that you continue on your current medications as directed. Please refer to the Current Medication list given to you today.  *If you need a refill on your cardiac medications before your next appointment,  please call your pharmacy*   Lab Work: IN 7-14 DAYS: BNP, BMP, FLP ( nothing to eat or drink 8-12 hours prior) If you have labs (blood work) drawn today and your tests are completely normal, you Danny receive your results only by: [place] (if you have MyChart) OR A paper copy in the mail If you have any lab test that is abnormal or we need to change your treatment, we Danny call you to review the results.   Testing/Procedures: Your physician has requested that you have an echocardiogram. Echocardiography is a painless test that uses sound waves to create images of your heart. It provides your doctor with information about the size and shape of your heart and how well your hearts chambers and valves are working. This procedure takes approximately one hour. There are no restrictions for this procedure.    Follow-Up: to be determined.  At [facility], you and your health needs are our priority.  As part of our continuing mission to provide you with exceptional heart care, we have created designated Provider Care Teams.  These Care Teams include your primary Cardiologist (physician) and Advanced Practice Providers (APPs -  Physician Assistants and Nurse Practitioners) who all work together to provide you with the care you need, when you need it.  We recommend signing up for the patient portal called "MyChart".  Sign up information is provided on this After Visit Summary.  MyChart is used to connect with patients for Virtual Visits (Telemedicine).  Patients are able to view lab/test results, encounter notes, upcoming appointments, etc.  Non-urgent messages can be sent to your provider as well.   To learn more about what you can do with MyChart, go to [email or web address].       Signed, Werner Lean,  MD  10/17/2021 5:41 PM    [place]

## 2021-10-17 NOTE — Patient Instructions (Signed)
Medication Instructions:  Your physician recommends that you continue on your current medications as directed. Please refer to the Current Medication list given to you today.  *If you need a refill on your cardiac medications before your next appointment, please call your pharmacy*   Lab Work: IN 7-14 DAYS: BNP, BMP, FLP ( nothing to eat or drink 8-12 hours prior) If you have labs (blood work) drawn today and your tests are completely normal, you will receive your results only by: [place] (if you have MyChart) OR A paper copy in the mail If you have any lab test that is abnormal or we need to change your treatment, we will call you to review the results.   Testing/Procedures: Your physician has requested that you have an echocardiogram. Echocardiography is a painless test that uses sound waves to create images of your heart. It provides your doctor with information about the size and shape of your heart and how well your hearts chambers and valves are working. This procedure takes approximately one hour. There are no restrictions for this procedure.    Follow-Up: to be determined.  At [facility], you and your health needs are our priority.  As part of our continuing mission to provide you with exceptional heart care, we have created designated Provider Care Teams.  These Care Teams include your primary Cardiologist (physician) and Advanced Practice Providers (APPs -  Physician Assistants and Nurse Practitioners) who all work together to provide you with the care you need, when you need it.  We recommend signing up for the patient portal called "MyChart".  Sign up information is provided on this After Visit Summary.  MyChart is used to connect with patients for Virtual Visits (Telemedicine).  Patients are able to view lab/test results, encounter notes, upcoming appointments, etc.  Non-urgent messages can be sent to your provider as well.   To learn more about what you can do with  MyChart, go to [email or web address].

## 2021-10-27 ENCOUNTER — Other Ambulatory Visit: Payer: Medicare Other | Admitting: *Deleted

## 2021-10-27 ENCOUNTER — Other Ambulatory Visit: Payer: Self-pay

## 2021-10-27 ENCOUNTER — Ambulatory Visit (HOSPITAL_COMMUNITY): Payer: Medicare Other | Attending: Cardiovascular Disease

## 2021-10-27 DIAGNOSIS — R600 Localized edema: Secondary | ICD-10-CM

## 2021-10-27 DIAGNOSIS — R0609 Other forms of dyspnea: Secondary | ICD-10-CM | POA: Insufficient documentation

## 2021-10-27 DIAGNOSIS — I2510 Atherosclerotic heart disease of native coronary artery without angina pectoris: Secondary | ICD-10-CM | POA: Diagnosis not present

## 2021-10-27 DIAGNOSIS — E785 Hyperlipidemia, unspecified: Secondary | ICD-10-CM | POA: Diagnosis not present

## 2021-10-27 LAB — ECHOCARDIOGRAM COMPLETE
Area-P 1/2: 3.63 cm2
S' Lateral: 3.8 cm

## 2021-10-28 LAB — BASIC METABOLIC PANEL
BUN/Creatinine Ratio: 19 (ref 10–24)
BUN: 13 mg/dL (ref 8–27)
CO2: 28 mmol/L (ref 20–29)
Calcium: 9.7 mg/dL (ref 8.6–10.2)
Chloride: 102 mmol/L (ref 96–106)
Creatinine, Ser: 0.68 mg/dL — ABNORMAL LOW (ref 0.76–1.27)
Glucose: 94 mg/dL (ref 70–99)
Potassium: 4.5 mmol/L (ref 3.5–5.2)
Sodium: 141 mmol/L (ref 134–144)
eGFR: 97 mL/min/{1.73_m2} (ref >59)

## 2021-10-28 LAB — LIPID PANEL
Chol/HDL Ratio: 2.6 ratio (ref 0.0–5.0)
Cholesterol, Total: 119 mg/dL (ref 100–199)
HDL: 46 mg/dL (ref >39)
LDL Chol Calc (NIH): 50 mg/dL (ref 0–99)
Triglycerides: 128 mg/dL (ref 0–149)
VLDL Cholesterol Cal: 23 mg/dL (ref 5–40)

## 2021-10-28 LAB — PRO B NATRIURETIC PEPTIDE: NT-Pro BNP: 88 pg/mL (ref 0–486)

## 2021-12-25 DIAGNOSIS — G4733 Obstructive sleep apnea (adult) (pediatric): Secondary | ICD-10-CM | POA: Diagnosis not present

## 2022-01-04 DIAGNOSIS — G4733 Obstructive sleep apnea (adult) (pediatric): Secondary | ICD-10-CM | POA: Diagnosis not present

## 2022-02-14 DIAGNOSIS — H02831 Dermatochalasis of right upper eyelid: Secondary | ICD-10-CM | POA: Diagnosis not present

## 2022-02-14 DIAGNOSIS — H04123 Dry eye syndrome of bilateral lacrimal glands: Secondary | ICD-10-CM | POA: Diagnosis not present

## 2022-02-14 DIAGNOSIS — H02834 Dermatochalasis of left upper eyelid: Secondary | ICD-10-CM | POA: Diagnosis not present

## 2022-02-14 DIAGNOSIS — H43812 Vitreous degeneration, left eye: Secondary | ICD-10-CM | POA: Diagnosis not present

## 2022-02-14 DIAGNOSIS — Z961 Presence of intraocular lens: Secondary | ICD-10-CM | POA: Diagnosis not present

## 2022-02-14 DIAGNOSIS — G4733 Obstructive sleep apnea (adult) (pediatric): Secondary | ICD-10-CM | POA: Diagnosis not present

## 2022-02-14 DIAGNOSIS — D3132 Benign neoplasm of left choroid: Secondary | ICD-10-CM | POA: Diagnosis not present

## 2022-02-22 DIAGNOSIS — Z86010 Personal history of colonic polyps: Secondary | ICD-10-CM | POA: Diagnosis not present

## 2022-03-23 DIAGNOSIS — M109 Gout, unspecified: Secondary | ICD-10-CM | POA: Diagnosis not present

## 2022-03-23 DIAGNOSIS — Z23 Encounter for immunization: Secondary | ICD-10-CM | POA: Diagnosis not present

## 2022-03-23 DIAGNOSIS — G4730 Sleep apnea, unspecified: Secondary | ICD-10-CM | POA: Diagnosis not present

## 2022-03-23 DIAGNOSIS — J45909 Unspecified asthma, uncomplicated: Secondary | ICD-10-CM | POA: Diagnosis not present

## 2022-03-23 DIAGNOSIS — J309 Allergic rhinitis, unspecified: Secondary | ICD-10-CM | POA: Diagnosis not present

## 2022-03-23 DIAGNOSIS — R7303 Prediabetes: Secondary | ICD-10-CM | POA: Diagnosis not present

## 2022-03-23 DIAGNOSIS — M79673 Pain in unspecified foot: Secondary | ICD-10-CM | POA: Diagnosis not present

## 2022-03-23 DIAGNOSIS — I10 Essential (primary) hypertension: Secondary | ICD-10-CM | POA: Diagnosis not present

## 2022-03-23 DIAGNOSIS — E782 Mixed hyperlipidemia: Secondary | ICD-10-CM | POA: Diagnosis not present

## 2022-03-23 DIAGNOSIS — I495 Sick sinus syndrome: Secondary | ICD-10-CM | POA: Diagnosis not present

## 2022-03-31 ENCOUNTER — Encounter: Payer: Self-pay | Admitting: Podiatry

## 2022-03-31 ENCOUNTER — Ambulatory Visit (INDEPENDENT_AMBULATORY_CARE_PROVIDER_SITE_OTHER): Payer: Medicare Other

## 2022-03-31 ENCOUNTER — Ambulatory Visit: Payer: Medicare Other | Admitting: Podiatry

## 2022-03-31 DIAGNOSIS — M5416 Radiculopathy, lumbar region: Secondary | ICD-10-CM | POA: Diagnosis not present

## 2022-03-31 DIAGNOSIS — M722 Plantar fascial fibromatosis: Secondary | ICD-10-CM | POA: Diagnosis not present

## 2022-03-31 DIAGNOSIS — M778 Other enthesopathies, not elsewhere classified: Secondary | ICD-10-CM

## 2022-03-31 MED ORDER — MELOXICAM 15 MG PO TABS: 15.0000 mg | ORAL_TABLET | Freq: Every day | ORAL | 0 refills | Status: DC

## 2022-05-15 ENCOUNTER — Ambulatory Visit: Payer: Medicare Other | Admitting: Podiatry

## 2022-05-17 DIAGNOSIS — Z09 Encounter for follow-up examination after completed treatment for conditions other than malignant neoplasm: Secondary | ICD-10-CM | POA: Diagnosis not present

## 2022-05-17 DIAGNOSIS — Z86010 Personal history of colonic polyps: Secondary | ICD-10-CM | POA: Diagnosis not present

## 2022-05-17 DIAGNOSIS — K5730 Diverticulosis of large intestine without perforation or abscess without bleeding: Secondary | ICD-10-CM | POA: Diagnosis not present

## 2022-05-17 DIAGNOSIS — K649 Unspecified hemorrhoids: Secondary | ICD-10-CM | POA: Diagnosis not present

## 2022-05-17 DIAGNOSIS — Z980 Intestinal bypass and anastomosis status: Secondary | ICD-10-CM | POA: Diagnosis not present

## 2022-07-03 DIAGNOSIS — G4733 Obstructive sleep apnea (adult) (pediatric): Secondary | ICD-10-CM | POA: Diagnosis not present

## 2022-07-10 DIAGNOSIS — M25562 Pain in left knee: Secondary | ICD-10-CM | POA: Diagnosis not present

## 2022-07-10 DIAGNOSIS — M1712 Unilateral primary osteoarthritis, left knee: Secondary | ICD-10-CM | POA: Diagnosis not present

## 2022-07-31 DIAGNOSIS — Z23 Encounter for immunization: Secondary | ICD-10-CM | POA: Diagnosis not present

## 2022-08-02 ENCOUNTER — Ambulatory Visit: Payer: Medicare Other | Attending: Cardiology | Admitting: Cardiology

## 2022-08-02 ENCOUNTER — Encounter: Payer: Self-pay | Admitting: Cardiology

## 2022-08-02 VITALS — BP 122/74 | HR 51 | Ht 66.0 in | Wt 244.0 lb

## 2022-08-02 DIAGNOSIS — I495 Sick sinus syndrome: Secondary | ICD-10-CM | POA: Diagnosis not present

## 2022-08-02 DIAGNOSIS — I10 Essential (primary) hypertension: Secondary | ICD-10-CM | POA: Diagnosis not present

## 2022-08-02 DIAGNOSIS — I2510 Atherosclerotic heart disease of native coronary artery without angina pectoris: Secondary | ICD-10-CM | POA: Diagnosis not present

## 2022-08-02 NOTE — Progress Notes (Signed)
Electrophysiology Office Note   Date:  08/02/2022   ID:  ADI DORO, DOB 07-10-1946, MRN [id number]  PCP:  Antony Contras, MD  Cardiologist:  Radford Pax Primary Electrophysiologist:  Constance Haw, MD    No chief complaint on file.    History of Present Illness: Danny Lee is a 76 y.o. male who presents today for electrophysiology evaluation.     He has a history significant hypertension, hyperlipidemia prostate cancer, asthma, sinus bradycardia.  He wore a cardiac monitor 05/23/2020 which showed an average heart rate of 66 bpm.  He was bradycardic at 36 bpm, though this was nocturnal.  He complained of chest pain and had a coronary CT that showed an elevated calcium score with FFR without evidence of obstruction.  Today, denies symptoms of palpitations, chest pain, shortness of breath, orthopnea, PND, lower extremity edema, claudication, dizziness, presyncope, syncope, bleeding, or neurologic sequela. The patient is tolerating medications without difficulties.  Since being seen he has done well.  He continues to have episodes of fatigue, but otherwise has no major complaints.  He is able to do all his daily activities without restriction.  Left ear   Past Medical History:  Diagnosis Date   Allergic rhinitis    Asthma    mild   BMI 40.0-44.9, adult (HCC)    Dyspnea    a. Prior eval in 2009 nl. b.    ED (erectile dysfunction)    Esophageal dysphagia    Gout    rare   Hearing aid worn    Bilateral   Hx of colonic polyps    Hypercholesteremia    Hypertension    Obesity    OSA (obstructive sleep apnea)    SPLIT 06/16/13 ESS 10,AHI 36/hr REM 71/hr, RDI same, O2 minn 68%, CPAP 12 w AHI 2, APAP recommeded   PAT (paroxysmal atrial tachycardia)    a. Brief nonsustained atrial tach on event monitor in 2015 - 4-6 beats.   Prediabetes    Premature atrial contractions    a. By event monitor 2015.   Prostate cancer ([place]) 2010   Sinus bradycardia    a. HR 45 in 2015.    Tobacco abuse    Past Surgical History:  Procedure Laterality Date   CHOLECYSTECTOMY     HEEL SPUR SURGERY     HEMICOLECTOMY Right    and Lysis of adhesions   KNEE ARTHROSCOPY Left    PROSTATE SURGERY     PROSTATECTOMY  02/2011     Current Outpatient Medications  Medication Sig Dispense Refill   albuterol (VENTOLIN HFA) 108 (90 Base) MCG/ACT inhaler SMARTSIG:1-2 Puff(s) By Mouth Every 4 Hours PRN     atorvastatin (LIPITOR) 40 MG tablet Take 40 mg by mouth every morning.     losartan (COZAAR) 100 MG tablet Take 1 tablet by mouth every morning.     Multiple Vitamin (MULITIVITAMIN WITH MINERALS) TABS Take 1 tablet by mouth daily.     No current facility-administered medications for this visit.    Allergies:   Amlodipine besylate and Aspirin   Social History:  The patient  reports that he has been smoking cigars. He uses smokeless tobacco. He reports that he does not drink alcohol and does not use drugs.   Family History:  The patient's family history includes CAD in his brother; Heart attack in his brother; Hypertension in his brother.   ROS:  Please see the history of present illness.   Otherwise, review of systems is positive  for none.   All other systems are reviewed and negative.   PHYSICAL EXAM: VS:  BP 122/74   Pulse (!) 51   Ht '5\' 6"'$  (1.676 m)   Wt 244 lb (110.7 kg)   SpO2 96%   BMI 39.38 kg/m  , BMI Body mass index is 39.38 kg/m. GEN: Well nourished, well developed, in no acute distress  HEENT: normal  Neck: no JVD, carotid bruits, or masses Cardiac: RRR; no murmurs, rubs, or gallops,no edema  Respiratory:  clear to auscultation bilaterally, normal work of breathing GI: soft, nontender, nondistended, + BS MS: no deformity or atrophy  Skin: warm and dry Neuro:  Strength and sensation are intact Psych: euthymic mood, full affect  EKG:  EKG is ordered today. Personal review of the ekg ordered shows sinus rhythm, rate 51   Recent Labs: 10/27/2021: BUN 13;  Creatinine, Ser 0.68; NT-Pro BNP 88; Potassium 4.5; Sodium 141    Lipid Panel     Component Value Date/Time   CHOL 119 10/27/2021 0747   TRIG 128 10/27/2021 0747   HDL 46 10/27/2021 0747   CHOLHDL 2.6 10/27/2021 0747   LDLCALC 50 10/27/2021 0747     Wt Readings from Last 3 Encounters:  08/02/22 244 lb (110.7 kg)  10/17/21 251 lb (113.9 kg)  09/26/21 250 lb (113.4 kg)      Other studies Reviewed: Additional studies/ records that were reviewed today include:  TTE 10/27/21  1. Left ventricular ejection fraction, by estimation, is 55 to 60%. The  left ventricle has normal function. The left ventricle has no regional  wall motion abnormalities. There is mild left ventricular hypertrophy.  Left ventricular diastolic parameters  were normal.   2. Right ventricular systolic function is normal. The right ventricular  size is normal.   3. Left atrial size was moderately dilated.   4. The mitral valve is abnormal. Trivial mitral valve regurgitation. No  evidence of mitral stenosis.   5. The aortic valve was not well visualized. There is mild calcification  of the aortic valve. Aortic valve regurgitation is not visualized. Aortic  valve sclerosis is present, with no evidence of aortic valve stenosis.   6. Aortic dilatation noted. There is mild dilatation of the aortic root,  measuring 41 mm.   7. The inferior vena cava is normal in size with greater than 50%  respiratory variability, suggesting right atrial pressure of 3 mmHg.    ASSESSMENT AND PLAN:  1.  Sick sinus syndrome: Heart rates in the 40s to 60s at doctors appointments.  He is having some fatigue.  His heart rates are in the 50s now and average on his most recent monitor in the 60s.  He would prefer to avoid further monitoring.  We Derian Dimalanta continue with current management.  2.  Hypertension: Continue home medications.  Well-controlled.  3.  Obstructive sleep apnea: CPAP compliance encouraged  4.  Coronary artery disease:  Coronary calcium score of 828.  FFR without obstruction. Continue atorvastatin 40 mg daily.   Current medicines are reviewed at length with the patient today.   The patient does not have concerns regarding his medicines.  The following changes were made today: None  Labs/ tests ordered today include:  Orders Placed This Encounter  Procedures   EKG 12-Lead     Disposition:   FU with Ashly Goethe 1 year  Signed, Bronnie Vasseur Meredith Leeds, MD  08/02/2022 9:32 AM     [facility] [address] [place]  [place] [address] [phone] (office) [phone] (fax)

## 2022-09-09 DIAGNOSIS — M25562 Pain in left knee: Secondary | ICD-10-CM | POA: Diagnosis not present

## 2022-09-12 ENCOUNTER — Telehealth: Payer: Self-pay | Admitting: *Deleted

## 2022-09-12 DIAGNOSIS — S83242A Other tear of medial meniscus, current injury, left knee, initial encounter: Secondary | ICD-10-CM | POA: Diagnosis not present

## 2022-09-12 DIAGNOSIS — M25562 Pain in left knee: Secondary | ICD-10-CM | POA: Diagnosis not present

## 2022-09-12 DIAGNOSIS — M1712 Unilateral primary osteoarthritis, left knee: Secondary | ICD-10-CM | POA: Diagnosis not present

## 2022-09-12 NOTE — Telephone Encounter (Signed)
   Pre-operative Risk Assessment    Patient Name: Danny Danny  DOB: 1945/10/23 MRN: [id number]      Request for Surgical Clearance    Procedure:   LEFT KNEE SCOPE  Date of Surgery:  Clearance TBD                                 Surgeon:  DR. Esmond Plants Surgeon's Group or Practice Name:  [facility] Phone number:  [phone] Fax number:  [phone] ATTN: MEGAN DAVIS   Type of Clearance Requested:   - Medical ; NO MEDICATIONS LISTED AS NEEDING TO BE HELD   Type of Anesthesia:   CHOICE   Additional requests/questions:    Danny Danny   09/12/2022, 6:02 PM

## 2022-09-13 ENCOUNTER — Telehealth: Payer: Self-pay

## 2022-09-13 NOTE — Telephone Encounter (Signed)
  Patient Consent for Virtual Visit        Danny Lee has provided verbal consent on 09/13/2022 for a virtual visit (video or telephone).   CONSENT FOR VIRTUAL VISIT FOR:  Danny Lee  By participating in this virtual visit I agree to the following:  I hereby voluntarily request, consent and authorize [facility] and its employed or contracted physicians, physician assistants, nurse practitioners or other licensed health care professionals (the Practitioner), to provide me with telemedicine health care services (the "Services") as deemed necessary by the treating Practitioner. I acknowledge and consent to receive the Services by the Practitioner via telemedicine. I understand that the telemedicine visit will involve communicating with the Practitioner through live audiovisual communication technology and the disclosure of certain medical information by electronic transmission. I acknowledge that I have been given the opportunity to request an in-person assessment or other available alternative prior to the telemedicine visit and am voluntarily participating in the telemedicine visit.  I understand that I have the right to withhold or withdraw my consent to the use of telemedicine in the course of my care at any time, without affecting my right to future care or treatment, and that the Practitioner or I may terminate the telemedicine visit at any time. I understand that I have the right to inspect all information obtained and/or recorded in the course of the telemedicine visit and may receive copies of available information for a reasonable fee.  I understand that some of the potential risks of receiving the Services via telemedicine include:  Delay or interruption in medical evaluation due to technological equipment failure or disruption; Information transmitted may not be sufficient (e.g. poor resolution of images) to allow for appropriate medical decision making by the  Practitioner; and/or  In rare instances, security protocols could fail, causing a breach of personal health information.  Furthermore, I acknowledge that it is my responsibility to provide information about my medical history, conditions and care that is complete and accurate to the best of my ability. I acknowledge that Practitioner's advice, recommendations, and/or decision may be based on factors not within their control, such as incomplete or inaccurate data provided by me or distortions of diagnostic images or specimens that may result from electronic transmissions. I understand that the practice of medicine is not an exact science and that Practitioner makes no warranties or guarantees regarding treatment outcomes. I acknowledge that a copy of this consent can be made available to me via my patient portal ([facility]), or I can request a printed copy by calling the office of [facility].    I understand that my insurance will be billed for this visit.   I have read or had this consent read to me. I understand the contents of this consent, which adequately explains the benefits and risks of the Services being provided via telemedicine.  I have been provided ample opportunity to ask questions regarding this consent and the Services and have had my questions answered to my satisfaction. I give my informed consent for the services to be provided through the use of telemedicine in my medical care

## 2022-09-13 NOTE — Telephone Encounter (Signed)
Patient was called and schedule for telephone visit on Tuesday 09/18/22 at 9:40 AM. Virtual consent was done and in patient's chart. Patient verbalized understanding and all (if any) questions were answered.

## 2022-09-13 NOTE — Telephone Encounter (Signed)
   Name: Danny Lee  DOB: Sep 10, 1946  MRN: [id number]  Primary Cardiologist: None   Preoperative team, please contact this patient and set up a phone call appointment for further preoperative risk assessment. Please obtain consent and complete medication review. Thank you for your help.  I confirm that guidance regarding antiplatelet and oral anticoagulation therapy has been completed and, if necessary, noted below (none requested).    Lenna Sciara, NP 09/13/2022, 9:11 AM [place]

## 2022-09-18 ENCOUNTER — Telehealth: Payer: Medicare Other

## 2022-09-19 NOTE — Progress Notes (Unsigned)
Virtual Visit via Telephone Note   Because of Danny Lee co-morbid illnesses, he is at least at moderate risk for complications without adequate follow up.  This format is felt to be most appropriate for this patient at this time.  The patient did not have access to video technology/had technical difficulties with video requiring transitioning to audio format only (telephone).  All issues noted in this document were discussed and addressed.  No physical exam could be performed with this format.  Please refer to the patient's chart for his consent to telehealth for [facility].  Evaluation Performed:  Preoperative cardiovascular risk assessment _____________   Date:  09/19/2022   Patient ID:  Danny Lee, DOB [birth year], MRN [id number] Patient Location:  Home Provider location:   Office  Primary Care Provider:  Antony Contras, MD Primary Cardiologist:  None  Chief Complaint / Patient Profile   76 y.o. y/o male with a h/o HTN, OSA on CPAP, hyperlipidemia, prostate cancer, sinus bradycardia, CCTA that showed elevated calcium score with no evidence of obstruction on FFR, morbid obesity who is pending left knee scope and presents today for telephonic preoperative cardiovascular risk assessment.  History of Present Illness    Danny Lee is a 76 y.o. male who presents via audio/video conferencing for a telehealth visit today.  Pt was last seen in cardiology clinic on 08/02/22 by Dr. Curt Bears.  At that time Danny Lee was doing well. He was having some fatigue with HRs 40-60s, with average on most recent monitor in the 60s, plan to continue to monitor.  The patient is now pending procedure as outlined above. Since his last visit, he  denies chest pain, shortness of breath, lower extremity edema, fatigue, palpitations, melena, hematuria, hemoptysis, diaphoresis, weakness, presyncope, syncope, orthopnea, and PND.  He is able to achieve > 4 METS activity without  concerning cardiac symptoms.    Past Medical History    Past Medical History:  Diagnosis Date   Allergic rhinitis    Asthma    mild   BMI 40.0-44.9, adult (HCC)    Dyspnea    a. Prior eval in 2009 nl. b.    ED (erectile dysfunction)    Esophageal dysphagia    Gout    rare   Hearing aid worn    Bilateral   Hx of colonic polyps    Hypercholesteremia    Hypertension    Obesity    OSA (obstructive sleep apnea)    SPLIT 06/16/13 ESS 10,AHI 36/hr REM 71/hr, RDI same, O2 minn 68%, CPAP 12 w AHI 2, APAP recommeded   PAT (paroxysmal atrial tachycardia)    a. Brief nonsustained atrial tach on event monitor in 2015 - 4-6 beats.   Prediabetes    Premature atrial contractions    a. By event monitor 2015.   Prostate cancer ([place]) 2010   Sinus bradycardia    a. HR 45 in 2015.   Tobacco abuse    Past Surgical History:  Procedure Laterality Date   CHOLECYSTECTOMY     HEEL SPUR SURGERY     HEMICOLECTOMY Right    and Lysis of adhesions   KNEE ARTHROSCOPY Left    PROSTATE SURGERY     PROSTATECTOMY  02/2011    Allergies  Allergies  Allergen Reactions   Amlodipine Besylate Other (See Comments)    LE edema on 5 MG    Aspirin     Other reaction(s): easy bruising    Home Medications  Prior to Admission medications   Medication Sig Start Date End Date Taking? Authorizing Provider  albuterol (VENTOLIN HFA) 108 (90 Base) MCG/ACT inhaler SMARTSIG:1-2 Puff(s) By Mouth Every 4 Hours PRN 03/23/22   [provider]  atorvastatin (LIPITOR) 40 MG tablet Take 40 mg by mouth every morning. 02/08/14   [provider]  losartan (COZAAR) 100 MG tablet Take 1 tablet by mouth every morning. 01/16/16   [provider]  Multiple Vitamin (MULITIVITAMIN WITH MINERALS) TABS Take 1 tablet by mouth daily.    [provider]    Physical Exam    Vital Signs:  Danny Lee does not have vital signs available for review today.  Given telephonic nature of  communication, physical exam is limited. AAOx3. NAD. Normal affect.  Speech and respirations are unlabored.  Accessory Clinical Findings    None  Assessment & Plan    1.  Preoperative Cardiovascular Risk Assessment: The patient is doing well from a cardiac perspective. Therefore, based on ACC/AHA guidelines, the patient would be at acceptable risk for the planned procedure without further cardiovascular testing. According to the Revised Cardiac Risk Index (RCRI), his Perioperative Risk of Major Cardiac Event is (%): 0.9.  His Functional Capacity in METs is: 7.59 according to the [facility] Activity Status Index (DASI).   The patient was advised that if he develops new symptoms prior to surgery to contact our office to arrange for a follow-up visit, and he verbalized understanding.   A copy of this note will be routed to requesting surgeon.  Time:   Today, I have spent 6 minutes with the patient with telehealth technology discussing medical history, symptoms, and management plan.     Emmaline Life, NP-C  09/20/2022, 8:59 AM [address], [address] Office [phone] Fax [phone]

## 2022-09-20 ENCOUNTER — Encounter: Payer: Self-pay | Admitting: Nurse Practitioner

## 2022-09-20 ENCOUNTER — Ambulatory Visit: Payer: Medicare Other | Attending: Internal Medicine | Admitting: Nurse Practitioner

## 2022-09-20 DIAGNOSIS — Z01810 Encounter for preprocedural cardiovascular examination: Secondary | ICD-10-CM | POA: Diagnosis not present

## 2022-10-17 DIAGNOSIS — G8918 Other acute postprocedural pain: Secondary | ICD-10-CM | POA: Diagnosis not present

## 2022-10-17 DIAGNOSIS — S83232S Complex tear of medial meniscus, current injury, left knee, sequela: Secondary | ICD-10-CM | POA: Diagnosis not present

## 2022-10-17 DIAGNOSIS — X58XXXA Exposure to other specified factors, initial encounter: Secondary | ICD-10-CM | POA: Diagnosis not present

## 2022-10-17 DIAGNOSIS — M94262 Chondromalacia, left knee: Secondary | ICD-10-CM | POA: Diagnosis not present

## 2022-10-17 DIAGNOSIS — S83262A Peripheral tear of lateral meniscus, current injury, left knee, initial encounter: Secondary | ICD-10-CM | POA: Diagnosis not present

## 2022-10-17 DIAGNOSIS — S83232A Complex tear of medial meniscus, current injury, left knee, initial encounter: Secondary | ICD-10-CM | POA: Diagnosis not present

## 2022-10-17 DIAGNOSIS — Y999 Unspecified external cause status: Secondary | ICD-10-CM | POA: Diagnosis not present

## 2022-10-17 DIAGNOSIS — S83289A Other tear of lateral meniscus, current injury, unspecified knee, initial encounter: Secondary | ICD-10-CM | POA: Diagnosis not present

## 2022-11-02 DIAGNOSIS — M109 Gout, unspecified: Secondary | ICD-10-CM | POA: Diagnosis not present

## 2022-11-02 DIAGNOSIS — Z23 Encounter for immunization: Secondary | ICD-10-CM | POA: Diagnosis not present

## 2022-11-02 DIAGNOSIS — Z1331 Encounter for screening for depression: Secondary | ICD-10-CM | POA: Diagnosis not present

## 2022-11-02 DIAGNOSIS — I495 Sick sinus syndrome: Secondary | ICD-10-CM | POA: Diagnosis not present

## 2022-11-02 DIAGNOSIS — R7303 Prediabetes: Secondary | ICD-10-CM | POA: Diagnosis not present

## 2022-11-02 DIAGNOSIS — J45909 Unspecified asthma, uncomplicated: Secondary | ICD-10-CM | POA: Diagnosis not present

## 2022-11-02 DIAGNOSIS — E782 Mixed hyperlipidemia: Secondary | ICD-10-CM | POA: Diagnosis not present

## 2022-11-02 DIAGNOSIS — Z0000 Encounter for general adult medical examination without abnormal findings: Secondary | ICD-10-CM | POA: Diagnosis not present

## 2022-11-02 DIAGNOSIS — Z8546 Personal history of malignant neoplasm of prostate: Secondary | ICD-10-CM | POA: Diagnosis not present

## 2022-11-02 DIAGNOSIS — I10 Essential (primary) hypertension: Secondary | ICD-10-CM | POA: Diagnosis not present

## 2022-11-02 DIAGNOSIS — J309 Allergic rhinitis, unspecified: Secondary | ICD-10-CM | POA: Diagnosis not present

## 2022-12-20 DIAGNOSIS — E1151 Type 2 diabetes mellitus with diabetic peripheral angiopathy without gangrene: Secondary | ICD-10-CM | POA: Diagnosis not present

## 2022-12-20 DIAGNOSIS — F1021 Alcohol dependence, in remission: Secondary | ICD-10-CM | POA: Diagnosis not present

## 2022-12-20 DIAGNOSIS — M109 Gout, unspecified: Secondary | ICD-10-CM | POA: Diagnosis not present

## 2022-12-20 DIAGNOSIS — R32 Unspecified urinary incontinence: Secondary | ICD-10-CM | POA: Diagnosis not present

## 2022-12-20 DIAGNOSIS — K5900 Constipation, unspecified: Secondary | ICD-10-CM | POA: Diagnosis not present

## 2022-12-20 DIAGNOSIS — E1142 Type 2 diabetes mellitus with diabetic polyneuropathy: Secondary | ICD-10-CM | POA: Diagnosis not present

## 2022-12-20 DIAGNOSIS — Z8249 Family history of ischemic heart disease and other diseases of the circulatory system: Secondary | ICD-10-CM | POA: Diagnosis not present

## 2022-12-20 DIAGNOSIS — N529 Male erectile dysfunction, unspecified: Secondary | ICD-10-CM | POA: Diagnosis not present

## 2022-12-20 DIAGNOSIS — Z008 Encounter for other general examination: Secondary | ICD-10-CM | POA: Diagnosis not present

## 2022-12-20 DIAGNOSIS — J45909 Unspecified asthma, uncomplicated: Secondary | ICD-10-CM | POA: Diagnosis not present

## 2022-12-20 DIAGNOSIS — E785 Hyperlipidemia, unspecified: Secondary | ICD-10-CM | POA: Diagnosis not present

## 2022-12-20 DIAGNOSIS — I10 Essential (primary) hypertension: Secondary | ICD-10-CM | POA: Diagnosis not present

## 2022-12-31 DIAGNOSIS — G4733 Obstructive sleep apnea (adult) (pediatric): Secondary | ICD-10-CM | POA: Diagnosis not present

## 2023-01-02 DIAGNOSIS — G4733 Obstructive sleep apnea (adult) (pediatric): Secondary | ICD-10-CM | POA: Diagnosis not present

## 2023-01-31 DIAGNOSIS — G4733 Obstructive sleep apnea (adult) (pediatric): Secondary | ICD-10-CM | POA: Diagnosis not present

## 2023-02-22 DIAGNOSIS — H02834 Dermatochalasis of left upper eyelid: Secondary | ICD-10-CM | POA: Diagnosis not present

## 2023-02-22 DIAGNOSIS — Z961 Presence of intraocular lens: Secondary | ICD-10-CM | POA: Diagnosis not present

## 2023-02-22 DIAGNOSIS — H43812 Vitreous degeneration, left eye: Secondary | ICD-10-CM | POA: Diagnosis not present

## 2023-02-22 DIAGNOSIS — H04123 Dry eye syndrome of bilateral lacrimal glands: Secondary | ICD-10-CM | POA: Diagnosis not present

## 2023-02-22 DIAGNOSIS — G4733 Obstructive sleep apnea (adult) (pediatric): Secondary | ICD-10-CM | POA: Diagnosis not present

## 2023-02-22 DIAGNOSIS — H02831 Dermatochalasis of right upper eyelid: Secondary | ICD-10-CM | POA: Diagnosis not present

## 2023-02-22 DIAGNOSIS — D3132 Benign neoplasm of left choroid: Secondary | ICD-10-CM | POA: Diagnosis not present

## 2023-04-01 DIAGNOSIS — G4733 Obstructive sleep apnea (adult) (pediatric): Secondary | ICD-10-CM | POA: Diagnosis not present

## 2023-05-01 DIAGNOSIS — G4733 Obstructive sleep apnea (adult) (pediatric): Secondary | ICD-10-CM | POA: Diagnosis not present

## 2023-05-03 DIAGNOSIS — H52223 Regular astigmatism, bilateral: Secondary | ICD-10-CM | POA: Diagnosis not present

## 2023-05-03 DIAGNOSIS — Z8546 Personal history of malignant neoplasm of prostate: Secondary | ICD-10-CM | POA: Diagnosis not present

## 2023-05-03 DIAGNOSIS — R7303 Prediabetes: Secondary | ICD-10-CM | POA: Diagnosis not present

## 2023-05-03 DIAGNOSIS — M109 Gout, unspecified: Secondary | ICD-10-CM | POA: Diagnosis not present

## 2023-05-03 DIAGNOSIS — E782 Mixed hyperlipidemia: Secondary | ICD-10-CM | POA: Diagnosis not present

## 2023-05-03 DIAGNOSIS — I77810 Thoracic aortic ectasia: Secondary | ICD-10-CM | POA: Diagnosis not present

## 2023-05-03 DIAGNOSIS — G4730 Sleep apnea, unspecified: Secondary | ICD-10-CM | POA: Diagnosis not present

## 2023-05-03 DIAGNOSIS — H524 Presbyopia: Secondary | ICD-10-CM | POA: Diagnosis not present

## 2023-05-03 DIAGNOSIS — J45909 Unspecified asthma, uncomplicated: Secondary | ICD-10-CM | POA: Diagnosis not present

## 2023-05-03 DIAGNOSIS — Z6841 Body Mass Index (BMI) 40.0 and over, adult: Secondary | ICD-10-CM | POA: Diagnosis not present

## 2023-05-03 DIAGNOSIS — Z9989 Dependence on other enabling machines and devices: Secondary | ICD-10-CM | POA: Diagnosis not present

## 2023-05-03 DIAGNOSIS — I495 Sick sinus syndrome: Secondary | ICD-10-CM | POA: Diagnosis not present

## 2023-05-03 DIAGNOSIS — I10 Essential (primary) hypertension: Secondary | ICD-10-CM | POA: Diagnosis not present

## 2023-06-01 DIAGNOSIS — G4733 Obstructive sleep apnea (adult) (pediatric): Secondary | ICD-10-CM | POA: Diagnosis not present

## 2023-06-06 DIAGNOSIS — H43812 Vitreous degeneration, left eye: Secondary | ICD-10-CM | POA: Diagnosis not present

## 2023-06-06 DIAGNOSIS — Z961 Presence of intraocular lens: Secondary | ICD-10-CM | POA: Diagnosis not present

## 2023-06-06 DIAGNOSIS — H02834 Dermatochalasis of left upper eyelid: Secondary | ICD-10-CM | POA: Diagnosis not present

## 2023-06-06 DIAGNOSIS — D3132 Benign neoplasm of left choroid: Secondary | ICD-10-CM | POA: Diagnosis not present

## 2023-06-06 DIAGNOSIS — S0012XA Contusion of left eyelid and periocular area, initial encounter: Secondary | ICD-10-CM | POA: Diagnosis not present

## 2023-06-06 DIAGNOSIS — H02831 Dermatochalasis of right upper eyelid: Secondary | ICD-10-CM | POA: Diagnosis not present

## 2023-06-06 DIAGNOSIS — G4733 Obstructive sleep apnea (adult) (pediatric): Secondary | ICD-10-CM | POA: Diagnosis not present

## 2023-06-06 DIAGNOSIS — H04123 Dry eye syndrome of bilateral lacrimal glands: Secondary | ICD-10-CM | POA: Diagnosis not present

## 2023-06-11 DIAGNOSIS — Z23 Encounter for immunization: Secondary | ICD-10-CM | POA: Diagnosis not present

## 2023-07-19 DIAGNOSIS — G4733 Obstructive sleep apnea (adult) (pediatric): Secondary | ICD-10-CM | POA: Diagnosis not present

## 2023-11-21 ENCOUNTER — Encounter: Payer: Self-pay | Admitting: Cardiology

## 2023-11-21 ENCOUNTER — Ambulatory Visit: Payer: Medicare Other | Attending: Cardiology | Admitting: Cardiology

## 2023-11-21 VITALS — BP 104/58 | HR 49 | Ht 66.0 in | Wt 243.0 lb

## 2023-11-21 DIAGNOSIS — R001 Bradycardia, unspecified: Secondary | ICD-10-CM | POA: Diagnosis not present

## 2023-11-21 DIAGNOSIS — I491 Atrial premature depolarization: Secondary | ICD-10-CM

## 2023-11-21 DIAGNOSIS — I495 Sick sinus syndrome: Secondary | ICD-10-CM

## 2023-11-21 DIAGNOSIS — I4719 Other supraventricular tachycardia: Secondary | ICD-10-CM

## 2023-11-21 NOTE — Progress Notes (Signed)
  Electrophysiology Office Note:   Date:  11/21/2023  ID:  RAFEL GARDE, DOB 08/25/1946, MRN [id number]  Primary Cardiologist: None Primary Heart Failure: None Electrophysiologist: Kindsey Eblin Jorja Loa, MD      History of Present Illness:   Danny Lee is a 78 y.o. male with h/o hypertension, hyperlipidemia, prostate cancer, asthma, sinus bradycardia seen today for routine electrophysiology followup.   Since last being seen in our clinic the patient reports doing overall quite well.  He has no chest pain or shortness of breath.  He continues to be doing his daily activities.  A few months ago, he had a few seconds worth of chest pain.  This was not associated with exertion.  he denies chest pain, palpitations, dyspnea, PND, orthopnea, nausea, vomiting, dizziness, syncope, edema, weight gain, or early satiety.   Review of systems complete and found to be negative unless listed in HPI.   EP Information / Studies Reviewed:    EKG is ordered today. Personal review as below.  EKG Interpretation Date/Time:  Thursday November 21 2023 11:35:17 EST Ventricular Rate:  49 PR Interval:  192 QRS Duration:  112 QT Interval:  416 QTC Calculation: 375 R Axis:   -15  Text Interpretation: Sinus bradycardia with sinus arrhythmia Low voltage QRS Incomplete right bundle branch block When compared with ECG of 01-Feb-2011 12:13, No significant change since last tracing Confirmed by Shiree Altemus ([id number]) on 11/21/2023 11:43:00 AM     Risk Assessment/Calculations:              Physical Exam:   VS:  BP (!) 104/58 (BP Location: Right Arm, Patient Position: Sitting, Cuff Size: Large)   Pulse (!) 49   Ht 5\' 6"  (1.676 m)   Wt 243 lb (110.2 kg)   SpO2 95%   BMI 39.22 kg/m    Wt Readings from Last 3 Encounters:  11/21/23 243 lb (110.2 kg)  08/02/22 244 lb (110.7 kg)  10/17/21 251 lb (113.9 kg)     GEN: Well nourished, well developed in no acute distress NECK: No JVD; No carotid  bruits CARDIAC: Regular rate and rhythm, no murmurs, rubs, gallops RESPIRATORY:  Clear to auscultation without rales, wheezing or rhonchi  ABDOMEN: Soft, non-tender, non-distended EXTREMITIES:  No edema; No deformity   ASSESSMENT AND PLAN:    1.  Sick sinus syndrome: Heart rates in the 40s to 60s at doctors appointments.  He is having some fatigue.  Heart rates averaged in the 60s on his most recent cardiac monitor.  He is feeling well with some mild fatigue.  Aside from that, no acute complaints.  Continue with current management.  2.  Hypertension: Currently well-controlled.  Managed by primary physician.  3.  Obstructive sleep apnea: CPAP compliance encouraged  4.  Coronary artery disease: FFR without obstruction.  Calcium score of 828.  Continue atorvastatin 40 mg daily.  Follow up with EP APP in 12 months  Signed, Darika Ildefonso Jorja Loa, MD

## 2023-12-06 DIAGNOSIS — Z08 Encounter for follow-up examination after completed treatment for malignant neoplasm: Secondary | ICD-10-CM | POA: Diagnosis not present

## 2023-12-06 DIAGNOSIS — I10 Essential (primary) hypertension: Secondary | ICD-10-CM | POA: Diagnosis not present

## 2023-12-06 DIAGNOSIS — E782 Mixed hyperlipidemia: Secondary | ICD-10-CM | POA: Diagnosis not present

## 2023-12-06 DIAGNOSIS — R7303 Prediabetes: Secondary | ICD-10-CM | POA: Diagnosis not present

## 2023-12-06 DIAGNOSIS — M1712 Unilateral primary osteoarthritis, left knee: Secondary | ICD-10-CM | POA: Diagnosis not present

## 2023-12-06 DIAGNOSIS — I495 Sick sinus syndrome: Secondary | ICD-10-CM | POA: Diagnosis not present

## 2023-12-06 DIAGNOSIS — Z0000 Encounter for general adult medical examination without abnormal findings: Secondary | ICD-10-CM | POA: Diagnosis not present

## 2023-12-06 DIAGNOSIS — J45909 Unspecified asthma, uncomplicated: Secondary | ICD-10-CM | POA: Diagnosis not present

## 2023-12-06 DIAGNOSIS — M109 Gout, unspecified: Secondary | ICD-10-CM | POA: Diagnosis not present

## 2024-01-02 DIAGNOSIS — G4733 Obstructive sleep apnea (adult) (pediatric): Secondary | ICD-10-CM | POA: Diagnosis not present

## 2024-02-25 DIAGNOSIS — M25562 Pain in left knee: Secondary | ICD-10-CM | POA: Diagnosis not present

## 2024-03-06 DIAGNOSIS — H0102B Squamous blepharitis left eye, upper and lower eyelids: Secondary | ICD-10-CM | POA: Diagnosis not present

## 2024-03-06 DIAGNOSIS — H43812 Vitreous degeneration, left eye: Secondary | ICD-10-CM | POA: Diagnosis not present

## 2024-03-06 DIAGNOSIS — H0102A Squamous blepharitis right eye, upper and lower eyelids: Secondary | ICD-10-CM | POA: Diagnosis not present

## 2024-03-06 DIAGNOSIS — D3132 Benign neoplasm of left choroid: Secondary | ICD-10-CM | POA: Diagnosis not present

## 2024-03-06 DIAGNOSIS — H04123 Dry eye syndrome of bilateral lacrimal glands: Secondary | ICD-10-CM | POA: Diagnosis not present

## 2024-04-06 DIAGNOSIS — E782 Mixed hyperlipidemia: Secondary | ICD-10-CM | POA: Diagnosis not present

## 2024-04-06 DIAGNOSIS — J45909 Unspecified asthma, uncomplicated: Secondary | ICD-10-CM | POA: Diagnosis not present

## 2024-05-07 DIAGNOSIS — E782 Mixed hyperlipidemia: Secondary | ICD-10-CM | POA: Diagnosis not present

## 2024-05-07 DIAGNOSIS — J45909 Unspecified asthma, uncomplicated: Secondary | ICD-10-CM | POA: Diagnosis not present

## 2024-06-07 DIAGNOSIS — J45909 Unspecified asthma, uncomplicated: Secondary | ICD-10-CM | POA: Diagnosis not present

## 2024-06-07 DIAGNOSIS — E782 Mixed hyperlipidemia: Secondary | ICD-10-CM | POA: Diagnosis not present

## 2024-06-18 DIAGNOSIS — M1712 Unilateral primary osteoarthritis, left knee: Secondary | ICD-10-CM | POA: Diagnosis not present

## 2024-06-18 DIAGNOSIS — E782 Mixed hyperlipidemia: Secondary | ICD-10-CM | POA: Diagnosis not present

## 2024-06-18 DIAGNOSIS — I10 Essential (primary) hypertension: Secondary | ICD-10-CM | POA: Diagnosis not present

## 2024-06-18 DIAGNOSIS — R7303 Prediabetes: Secondary | ICD-10-CM | POA: Diagnosis not present

## 2024-06-18 DIAGNOSIS — Z23 Encounter for immunization: Secondary | ICD-10-CM | POA: Diagnosis not present

## 2024-06-18 DIAGNOSIS — M109 Gout, unspecified: Secondary | ICD-10-CM | POA: Diagnosis not present

## 2024-06-18 DIAGNOSIS — I495 Sick sinus syndrome: Secondary | ICD-10-CM | POA: Diagnosis not present

## 2024-06-18 DIAGNOSIS — R600 Localized edema: Secondary | ICD-10-CM | POA: Diagnosis not present

## 2024-06-18 DIAGNOSIS — I77810 Thoracic aortic ectasia: Secondary | ICD-10-CM | POA: Diagnosis not present

## 2024-06-18 DIAGNOSIS — J45909 Unspecified asthma, uncomplicated: Secondary | ICD-10-CM | POA: Diagnosis not present

## 2024-06-18 DIAGNOSIS — G4730 Sleep apnea, unspecified: Secondary | ICD-10-CM | POA: Diagnosis not present

## 2024-07-03 NOTE — Progress Notes (Addendum)
 Cardiology Office Note   Date:  07/06/2024  ID:  POSEY PETRIK, DOB 16-Aug-1946, MRN [id number] PCP: Seabron Lenis, MD  [facility] Providers Cardiologist:  Stanly DELENA Leavens, MD Electrophysiologist:  Will Gladis Norton, MD   History of Present Illness Danny Lee is a 78 y.o. male with a past medical history of hypertension, hyperlipidemia, prostate cancer, asthma, sinus bradycardia seen today for cardiology follow-up.  Was last seen February of this year by electrophysiology and was doing quite well at that time.  No chest pain or shortness of breath.  He continued to be doing his daily activities.  A few months ago he had a few seconds worth of chest pain.  This was not associated with exertion.  He denied chest pain, palpitations, dyspnea, PND, orthopnea, nausea, vomiting, dizziness, syncope, edema, weight gain, or early satiety.  Today, he presents with a hx of bradycardia and coronary artery disease who presents with episodes of clammy sweats and concerns about heart rate variability. He was referred by his primary doctor for evaluation of his heart rate and episodes of clammy sweats.  He experiences frequent episodes of clammy sweats, particularly in environments like [employer], without associated lightheadedness, dizziness, chest pain, or shortness of breath. His heart rate has been recorded in the forties and fifties. He perceives his heart beating faster or more vigorously when lying down at night.  He has bradycardia, with heart rates as low as 49 bpm in February. He previously wore a heart monitor overnight, but no further workup was deemed necessary. Two years ago, an echocardiogram showed an enlarged aorta and mitral valve thickening, with strong heart pump function. A 2023 CT scan indicated 24-50% stenosis in the left anterior descending artery, further analyzed with FFR. No recent coronary artery evaluation since then.  He is on Lipitor 40 mg daily, maintaining  cholesterol levels with an LDL of 59, triglycerides of 85, and HDL of 45. His A1c is 5.7, and creatinine and potassium levels are normal.  He uses a CPAP machine for sleep apnea but has issues with mask leakage, affecting sleep quality. He experiences persistent swelling in his feet and ankles, worsening when his legs are down during the day and only partially resolving overnight. He does not elevate his legs during the day.  Reports no chest pain, pressure, or tightness. No  orthopnea, PND.   Discussed the use of AI scribe software for clinical note transcription with the patient, who gave verbal consent to proceed.    ROS: Pertinent ROS in HPI  Studies Reviewed     Echocardiogram 10/27/2021 IMPRESSIONS     1. Left ventricular ejection fraction, by estimation, is 55 to 60%. The  left ventricle has normal function. The left ventricle has no regional  wall motion abnormalities. There is mild left ventricular hypertrophy.  Left ventricular diastolic parameters  were normal.   2. Right ventricular systolic function is normal. The right ventricular  size is normal.   3. Left atrial size was moderately dilated.   4. The mitral valve is abnormal. Trivial mitral valve regurgitation. No  evidence of mitral stenosis.   5. The aortic valve was not well visualized. There is mild calcification  of the aortic valve. Aortic valve regurgitation is not visualized. Aortic  valve sclerosis is present, with no evidence of aortic valve stenosis.   6. Aortic dilatation noted. There is mild dilatation of the aortic root,  measuring 41 mm.   7. The inferior vena cava is normal in  size with greater than 50%  respiratory variability, suggesting right atrial pressure of 3 mmHg.       Physical Exam VS:  BP 104/68   Pulse (!) 56   Ht 5' 6 (1.676 m)   Wt 240 lb 6.4 oz (109 kg)   SpO2 96%   BMI 38.80 kg/m        Wt Readings from Last 3 Encounters:  07/06/24 240 lb 6.4 oz (109 kg)  11/21/23 243 lb  (110.2 kg)  08/02/22 244 lb (110.7 kg)    GEN: Well nourished, well developed in no acute distress NECK: No JVD; No carotid bruits CARDIAC: RRR, no murmurs, rubs, gallops RESPIRATORY:  Clear to auscultation without rales, wheezing or rhonchi  ABDOMEN: Soft, non-tender, non-distended EXTREMITIES:  No edema; No deformity   ASSESSMENT AND PLAN  Atherosclerotic heart disease of native coronary artery Intermittent clammy sweats and episodes of feeling hot. Previous CT scan showed 24-50% LAD blockage, analyzed with FFR. - Order coronary CT angiogram to assess coronary arteries and compare with previous results. - Continue Lipitor 40 mg daily.  Sick sinus syndrome Heart rate in the fifties, previously forty-nine. No lightheadedness or dizziness. Dr. Carolyn evaluation indicated no further workup needed. - Continue current management without beta blockers.  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 - Order echocardiogram to reassess aortic root dilation and mitral valve thickening.  Lower extremity edema Chronic swelling in feet and ankles, not resolving overnight, increases with prolonged sitting or standing. - Order echocardiogram to assess heart pump function and aortic root size. - Recommend wearing compression socks to manage swelling.  Obstructive sleep apnea Continues CPAP use with mask leakage. No recent follow-up with sleep specialist. - Recommend replacing CPAP mask and ensuring proper fit to reduce leakage.        Dispo: He can return in 2-3 months  Signed, Danny LOISE Fabry, PA-C

## 2024-07-06 ENCOUNTER — Encounter: Payer: Self-pay | Admitting: Physician Assistant

## 2024-07-06 ENCOUNTER — Ambulatory Visit: Attending: Physician Assistant | Admitting: Physician Assistant

## 2024-07-06 VITALS — BP 104/68 | HR 56 | Ht 66.0 in | Wt 240.4 lb

## 2024-07-06 DIAGNOSIS — G4733 Obstructive sleep apnea (adult) (pediatric): Secondary | ICD-10-CM

## 2024-07-06 DIAGNOSIS — I2510 Atherosclerotic heart disease of native coronary artery without angina pectoris: Secondary | ICD-10-CM

## 2024-07-06 DIAGNOSIS — I10 Essential (primary) hypertension: Secondary | ICD-10-CM | POA: Diagnosis not present

## 2024-07-06 DIAGNOSIS — I495 Sick sinus syndrome: Secondary | ICD-10-CM | POA: Diagnosis not present

## 2024-07-06 NOTE — Patient Instructions (Addendum)
 Medication Instructions:  Your physician recommends that you continue on your current medications as directed. Please refer to the Current Medication list given to you today. *If you need a refill on your cardiac medications before your next appointment, please call your pharmacy*  Lab Work: None ordered If you have labs (blood work) drawn today and your tests are completely normal, you will receive your results only by: MyChart Message (if you have MyChart) OR A paper copy in the mail If you have any lab test that is abnormal or we need to change your treatment, we will call you to review the results.  Testing/Procedures: CORONARY CTA INSTRUCTIONS BELOW  Your physician has requested that you have an echocardiogram. Echocardiography is a painless test that uses sound waves to create images of your heart. It provides your doctor with information about the size and shape of your heart and how well your heart's chambers and valves are working. This procedure takes approximately one hour. There are no restrictions for this procedure. Please do NOT wear cologne, perfume, aftershave, or lotions (deodorant is allowed). Please arrive 15 minutes prior to your appointment time.  Please note: We ask at that you not bring children with you during ultrasound (echo/ vascular) testing. Due to room size and safety concerns, children are not allowed in the ultrasound rooms during exams. Our front office staff cannot provide observation of children in our lobby area while testing is being conducted. An adult accompanying a patient to their appointment will only be allowed in the ultrasound room at the discretion of the ultrasound technician under special circumstances. We apologize for any inconvenience.   Follow-Up: At [facility], you and your health needs are our priority.  As part of our continuing mission to provide you with exceptional heart care, our providers are all part of one team.  This  team includes your primary Cardiologist (physician) and Advanced Practice Providers or APPs (Physician Assistants and Nurse Practitioners) who all work together to provide you with the care you need, when you need it.  Your next appointment:   3 month(s)  Provider:   Orren Fabry, PA-C        We recommend signing up for the patient portal called MyChart.  Sign up information is provided on this After Visit Summary.  MyChart is used to connect with patients for Virtual Visits (Telemedicine).  Patients are able to view lab/test results, encounter notes, upcoming appointments, etc.  Non-urgent messages can be sent to your provider as well.   To learn more about what you can do with MyChart, go to [email or web address].   Other Instructions      Your cardiac CT will be scheduled at one of the below locations:   [facility] [address]  [place], [place] [address] [phone]   If scheduled at the Heart and Vascular Tower at [facility] street, please enter the parking lot using the [address] entrance and use the FREE valet service at the patient drop-off area. Enter the building and check-in with registration on the main floor.   Please follow these instructions carefully (unless otherwise directed):  An IV will be required for this test and Nitroglycerin  will be given.  Hold all erectile dysfunction medications at least 3 days (72 hrs) prior to test. (Ie viagra, cialis, sildenafil, tadalafil, etc)   On the Night Before the Test: Be sure to Drink plenty of water. Do not consume any caffeinated/decaffeinated beverages or chocolate 12 hours  prior to your test. Do not take any antihistamines 12 hours prior to your test.  On the Day of the Test: Drink plenty of water until 1 hour prior to the test. Do not eat any food 1 hour prior to test. You may take your regular medications prior to the test.     After the Test: Drink plenty of  water. After receiving IV contrast, you may experience a mild flushed feeling. This is normal. On occasion, you may experience a mild rash up to 24 hours after the test. This is not dangerous. If this occurs, you can take Benadryl 25 mg, Zyrtec, Claritin, or Allegra and increase your fluid intake. (Patients taking Tikosyn should avoid Benadryl, and may take Zyrtec, Claritin, or Allegra) If you experience trouble breathing, this can be serious. If it is severe call 911 IMMEDIATELY. If it is mild, please call our office.  We will call to schedule your test 2-4 weeks out understanding that some insurance companies will need an authorization prior to the service being performed.   For more information and frequently asked questions, please visit our website : [email or web address]  For non-scheduling related questions, please contact the cardiac imaging nurse navigator should you have any questions/concerns: Cardiac Imaging Nurse Navigators Direct Office Dial: [phone]   For scheduling needs, including cancellations and rescheduling, please call [place], [phone].

## 2024-07-07 DIAGNOSIS — J45909 Unspecified asthma, uncomplicated: Secondary | ICD-10-CM | POA: Diagnosis not present

## 2024-07-07 DIAGNOSIS — E782 Mixed hyperlipidemia: Secondary | ICD-10-CM | POA: Diagnosis not present

## 2024-07-08 DIAGNOSIS — G8929 Other chronic pain: Secondary | ICD-10-CM | POA: Diagnosis not present

## 2024-07-08 DIAGNOSIS — M25561 Pain in right knee: Secondary | ICD-10-CM | POA: Diagnosis not present

## 2024-07-08 DIAGNOSIS — M25562 Pain in left knee: Secondary | ICD-10-CM | POA: Diagnosis not present

## 2024-07-08 DIAGNOSIS — M1712 Unilateral primary osteoarthritis, left knee: Secondary | ICD-10-CM | POA: Diagnosis not present

## 2024-07-13 ENCOUNTER — Telehealth (HOSPITAL_COMMUNITY): Payer: Self-pay | Admitting: *Deleted

## 2024-07-13 NOTE — Telephone Encounter (Signed)
 Received call from patient regarding upcoming cardiac imaging study; pt verbalizes understanding of appt date/time, parking situation and where to check in, pre-test NPO status and medications ordered, and verified current allergies; name and call back number provided for further questions should they arise Danny Seats RN Navigator Cardiac Imaging [facility] [phone] office [phone] cell

## 2024-07-14 ENCOUNTER — Ambulatory Visit (HOSPITAL_COMMUNITY)
Admission: RE | Admit: 2024-07-14 | Discharge: 2024-07-14 | Disposition: A | Source: Ambulatory Visit | Attending: Nurse Practitioner | Admitting: Nurse Practitioner

## 2024-07-14 DIAGNOSIS — I495 Sick sinus syndrome: Secondary | ICD-10-CM | POA: Diagnosis not present

## 2024-07-14 DIAGNOSIS — G4733 Obstructive sleep apnea (adult) (pediatric): Secondary | ICD-10-CM | POA: Insufficient documentation

## 2024-07-14 DIAGNOSIS — R931 Abnormal findings on diagnostic imaging of heart and coronary circulation: Secondary | ICD-10-CM | POA: Diagnosis not present

## 2024-07-14 DIAGNOSIS — R0609 Other forms of dyspnea: Secondary | ICD-10-CM | POA: Diagnosis present

## 2024-07-14 DIAGNOSIS — I10 Essential (primary) hypertension: Secondary | ICD-10-CM | POA: Insufficient documentation

## 2024-07-14 DIAGNOSIS — I2510 Atherosclerotic heart disease of native coronary artery without angina pectoris: Secondary | ICD-10-CM | POA: Diagnosis not present

## 2024-07-14 MED ORDER — IOHEXOL 350 MG/ML SOLN
100.0000 mL | Freq: Once | INTRAVENOUS | Status: AC | PRN
  Administered 2024-07-14: 100 mL via INTRAVENOUS

## 2024-07-14 MED ORDER — NITROGLYCERIN 0.4 MG SL SUBL
0.8000 mg | SUBLINGUAL_TABLET | Freq: Once | SUBLINGUAL | Status: AC
  Administered 2024-07-14: 0.8 mg via SUBLINGUAL

## 2024-07-15 ENCOUNTER — Other Ambulatory Visit: Payer: Self-pay | Admitting: Cardiology

## 2024-07-15 ENCOUNTER — Ambulatory Visit (HOSPITAL_COMMUNITY)
Admission: RE | Admit: 2024-07-15 | Discharge: 2024-07-15 | Disposition: A | Source: Ambulatory Visit | Attending: Cardiology

## 2024-07-15 DIAGNOSIS — R931 Abnormal findings on diagnostic imaging of heart and coronary circulation: Secondary | ICD-10-CM

## 2024-07-15 DIAGNOSIS — I495 Sick sinus syndrome: Secondary | ICD-10-CM | POA: Diagnosis not present

## 2024-07-15 DIAGNOSIS — I2510 Atherosclerotic heart disease of native coronary artery without angina pectoris: Secondary | ICD-10-CM

## 2024-07-15 DIAGNOSIS — I10 Essential (primary) hypertension: Secondary | ICD-10-CM | POA: Diagnosis not present

## 2024-07-15 DIAGNOSIS — G4733 Obstructive sleep apnea (adult) (pediatric): Secondary | ICD-10-CM | POA: Diagnosis not present

## 2024-07-17 ENCOUNTER — Ambulatory Visit: Payer: Self-pay | Admitting: Physician Assistant

## 2024-07-21 MED ORDER — RANOLAZINE ER 500 MG PO TB12: 500.0000 mg | ORAL_TABLET | Freq: Two times a day (BID) | ORAL | 6 refills | Status: DC

## 2024-08-14 ENCOUNTER — Ambulatory Visit (HOSPITAL_COMMUNITY)
Admission: RE | Admit: 2024-08-14 | Discharge: 2024-08-14 | Disposition: A | Discharge status: Home / Self Care | Source: Ambulatory Visit | Attending: Nurse Practitioner | Admitting: Nurse Practitioner

## 2024-08-14 DIAGNOSIS — R0600 Dyspnea, unspecified: Secondary | ICD-10-CM | POA: Diagnosis not present

## 2024-08-14 DIAGNOSIS — I495 Sick sinus syndrome: Secondary | ICD-10-CM | POA: Diagnosis present

## 2024-08-14 DIAGNOSIS — I2510 Atherosclerotic heart disease of native coronary artery without angina pectoris: Secondary | ICD-10-CM | POA: Insufficient documentation

## 2024-08-14 DIAGNOSIS — G4733 Obstructive sleep apnea (adult) (pediatric): Secondary | ICD-10-CM | POA: Insufficient documentation

## 2024-08-14 DIAGNOSIS — I10 Essential (primary) hypertension: Secondary | ICD-10-CM | POA: Insufficient documentation

## 2024-08-14 LAB — ECHOCARDIOGRAM COMPLETE
Area-P 1/2: 4.89 cm2
S' Lateral: 3.25 cm

## 2024-08-14 MED ORDER — PERFLUTREN LIPID MICROSPHERE
1.0000 mL | INTRAVENOUS | Status: AC | PRN
  Administered 2024-08-14: 3 mL via INTRAVENOUS

## 2024-08-19 ENCOUNTER — Ambulatory Visit: Payer: Self-pay | Admitting: Physician Assistant

## 2024-08-19 DIAGNOSIS — I2510 Atherosclerotic heart disease of native coronary artery without angina pectoris: Secondary | ICD-10-CM

## 2024-08-26 NOTE — Telephone Encounter (Signed)
  Patient is calling back to follow up who he needs to see for follow up. He said he is available around  3-4 pm today.

## 2024-08-27 NOTE — Telephone Encounter (Signed)
 Orders for POET has been placed.   Reviewed ETT instructions with the patient. He voiced understanding.   Patient wants to know if we can give him something cheaper than Ranexa. I will also send a message to patient assistance team to see if we can help the patient with other resources.   Patient also wants to know if he needs to see Dr Santo?

## 2024-08-31 ENCOUNTER — Telehealth (HOSPITAL_COMMUNITY): Payer: Self-pay

## 2024-08-31 NOTE — Telephone Encounter (Signed)
 Spoke with the patient, detailed instructions given. S.Sueo Cullen CCT

## 2024-09-01 ENCOUNTER — Ambulatory Visit (HOSPITAL_COMMUNITY)
Admission: RE | Admit: 2024-09-01 | Discharge: 2024-09-01 | Disposition: A | Discharge status: Home / Self Care | Source: Ambulatory Visit | Attending: Physician Assistant | Admitting: Physician Assistant

## 2024-09-01 DIAGNOSIS — I2510 Atherosclerotic heart disease of native coronary artery without angina pectoris: Secondary | ICD-10-CM | POA: Diagnosis not present

## 2024-09-01 LAB — EXERCISE TOLERANCE TEST
Angina Index: 0
Base ST Depression (mm): 0 mm
Duke Treadmill Score: 6
Estimated workload: 7.2
Exercise duration (min): 6 min
Exercise duration (sec): 10 s
MPHR: 142 {beats}/min
Peak HR: 131 {beats}/min
Percent HR: 92 %
RPE: 19
Rest HR: 54 {beats}/min
ST Depression (mm): 0 mm

## 2024-09-09 ENCOUNTER — Ambulatory Visit: Payer: Self-pay | Admitting: Physician Assistant

## 2024-09-10 NOTE — Telephone Encounter (Signed)
-----   Message from Orren LOISE Fabry sent at 09/09/2024  3:04 PM EST ----- Negative stress test without any EKG changes.  Great news!  If you have questions please let me know.  Orren LOISE Fabry, PA-C  ----- Message ----- From: Raford Riggs, MD Sent: 09/01/2024   3:15 PM EST To: Orren LOISE Fabry, PA-C

## 2024-09-10 NOTE — Telephone Encounter (Signed)
 Spoke with patient aware of results and verbalized understanding. Patient thanked for call. Patient stated  that if  bad weather will reschedule 12- 5 appt

## 2024-09-11 ENCOUNTER — Encounter: Payer: Self-pay | Admitting: Physician Assistant

## 2024-09-11 ENCOUNTER — Ambulatory Visit: Attending: Physician Assistant | Admitting: Physician Assistant

## 2024-09-11 VITALS — BP 108/82 | HR 61 | Ht 65.5 in | Wt 234.8 lb

## 2024-09-11 DIAGNOSIS — I10 Essential (primary) hypertension: Secondary | ICD-10-CM | POA: Diagnosis not present

## 2024-09-11 DIAGNOSIS — I2510 Atherosclerotic heart disease of native coronary artery without angina pectoris: Secondary | ICD-10-CM

## 2024-09-11 DIAGNOSIS — Q2543 Congenital aneurysm of aorta: Secondary | ICD-10-CM

## 2024-09-11 DIAGNOSIS — R001 Bradycardia, unspecified: Secondary | ICD-10-CM | POA: Diagnosis not present

## 2024-09-11 DIAGNOSIS — G4733 Obstructive sleep apnea (adult) (pediatric): Secondary | ICD-10-CM

## 2024-09-11 DIAGNOSIS — I495 Sick sinus syndrome: Secondary | ICD-10-CM

## 2024-09-11 MED ORDER — LOSARTAN POTASSIUM 100 MG PO TABS: 100.0000 mg | ORAL_TABLET | ORAL | 3 refills | Status: AC

## 2024-09-11 MED ORDER — ATORVASTATIN CALCIUM 40 MG PO TABS: 40.0000 mg | ORAL_TABLET | Freq: Every morning | ORAL | 3 refills | Status: AC

## 2024-09-11 NOTE — Patient Instructions (Signed)
 Medication Instructions:  Your physician recommends that you continue on your current medications as directed. Please refer to the Current Medication list given to you today.  *If you need a refill on your cardiac medications before your next appointment, please call your pharmacy*  Lab Work: None ordered If you have labs (blood work) drawn today and your tests are completely normal, you will receive your results only by: MyChart Message (if you have MyChart) OR A paper copy in the mail If you have any lab test that is abnormal or we need to change your treatment, we will call you to review the results.  Testing/Procedures: None ordered  Follow-Up: At [facility], you and your health needs are our priority.  As part of our continuing mission to provide you with exceptional heart care, our providers are all part of one team.  This team includes your primary Cardiologist (physician) and Advanced Practice Providers or APPs (Physician Assistants and Nurse Practitioners) who all work together to provide you with the care you need, when you need it.  Your next appointment:   3 month(s)  Provider:   Stanly DELENA Leavens, MD or Orren Fabry, PA-C   We recommend signing up for the patient portal called MyChart.  Sign up information is provided on this After Visit Summary.  MyChart is used to connect with patients for Virtual Visits (Telemedicine).  Patients are able to view lab/test results, encounter notes, upcoming appointments, etc.  Non-urgent messages can be sent to your provider as well.   To learn more about what you can do with MyChart, go to [email or web address].

## 2024-09-11 NOTE — Progress Notes (Signed)
 Cardiology Office Note   Date:  09/11/2024  ID:  GENTLE HOGE, DOB 06-16-1946, MRN [id number] PCP: Seabron Lenis, MD  [facility] Providers Cardiologist:  Stanly DELENA Leavens, MD Electrophysiologist:  Will Gladis Norton, MD   History of Present Illness GARDINER ESPANA is a 78 y.o. male with a past medical history of hypertension, hyperlipidemia, prostate cancer, asthma, sinus bradycardia seen today for cardiology follow-up.  Was last seen February of this year by electrophysiology and was doing quite well at that time.  No chest pain or shortness of breath.  He continued to be doing his daily activities.  A few months ago he had a few seconds worth of chest pain.  This was not associated with exertion.  He denied chest pain, palpitations, dyspnea, PND, orthopnea, nausea, vomiting, dizziness, syncope, edema, weight gain, or early satiety.  I saw him 9/29, he presents with a hx of bradycardia and coronary artery disease who presents with episodes of clammy sweats and concerns about heart rate variability. He was referred by his primary doctor for evaluation of his heart rate and episodes of clammy sweats.  He experiences frequent episodes of clammy sweats, particularly in environments like [employer], without associated lightheadedness, dizziness, chest pain, or shortness of breath. His heart rate has been recorded in the forties and fifties. He perceives his heart beating faster or more vigorously when lying down at night.  He has bradycardia, with heart rates as low as 49 bpm in February. He previously wore a heart monitor overnight, but no further workup was deemed necessary. Two years ago, an echocardiogram showed an enlarged aorta and mitral valve thickening, with strong heart pump function. A 2023 CT scan indicated 24-50% stenosis in the left anterior descending artery, further analyzed with FFR. No recent coronary artery evaluation since then.  He is on Lipitor 40 mg daily,  maintaining cholesterol levels with an LDL of 59, triglycerides of 85, and HDL of 45. His A1c is 5.7, and creatinine and potassium levels are normal.  He uses a CPAP machine for sleep apnea but has issues with mask leakage, affecting sleep quality. He experiences persistent swelling in his feet and ankles, worsening when his legs are down during the day and only partially resolving overnight. He does not elevate his legs during the day.  Reports no chest pain, pressure, or tightness. No  orthopnea, PND.   Discussed the use of AI scribe software for clinical note transcription with the patient, who gave verbal consent to proceed.  Today, he presents with a hx of coronary artery disease who presents with fatigue and shortness of breath.  He has ongoing fatigue and shortness of breath with intermittent episodes of needing to catch his breath. Chest pain is brief, lasting a second or two before resolving. He also has nonexertional sweating and clamminess.  He has coronary artery disease and recalls a CT in October with FFR of the LAD, circumflex, and right coronary arteries. A stress test was negative for EKG changes.  He stopped Ranexa  due to cost and no clear benefit. He has nitroglycerin  but has not used it recently. He is not taking aspirin because of easy bruising. His September lipids were LDL 59, triglycerides 85, and HDL 45.  He has ankle swelling that he controls with compression socks. He reports poor sleep, probable sleep apnea, and problems with his CPAP equipment, with marked daytime sleepiness.  His daughter notes that he reports foot pain with exercise. Suggestions for plantar fasciitis was recommended.  Reports no chest pain, pressure, or tightness. No edema, orthopnea, PND. Reports no palpitations.   Discussed the use of AI scribe software for clinical note transcription with the patient, who gave verbal consent to proceed.  ROS: Pertinent ROS in HPI  Studies Reviewed      Echocardiogram 10/27/2021 IMPRESSIONS     1. Left ventricular ejection fraction, by estimation, is 55 to 60%. The  left ventricle has normal function. The left ventricle has no regional  wall motion abnormalities. There is mild left ventricular hypertrophy.  Left ventricular diastolic parameters  were normal.   2. Right ventricular systolic function is normal. The right ventricular  size is normal.   3. Left atrial size was moderately dilated.   4. The mitral valve is abnormal. Trivial mitral valve regurgitation. No  evidence of mitral stenosis.   5. The aortic valve was not well visualized. There is mild calcification  of the aortic valve. Aortic valve regurgitation is not visualized. Aortic  valve sclerosis is present, with no evidence of aortic valve stenosis.   6. Aortic dilatation noted. There is mild dilatation of the aortic root,  measuring 41 mm.   7. The inferior vena cava is normal in size with greater than 50%  respiratory variability, suggesting right atrial pressure of 3 mmHg.       Physical Exam VS:  BP 108/82   Pulse 61   Ht 5' 5.5 (1.664 m)   Wt 234 lb 12.8 oz (106.5 kg)   SpO2 97%   BMI 38.48 kg/m        Wt Readings from Last 3 Encounters:  09/11/24 234 lb 12.8 oz (106.5 kg)  09/01/24 240 lb (108.9 kg)  07/06/24 240 lb 6.4 oz (109 kg)    GEN: Well nourished, well developed in no acute distress NECK: No JVD; No carotid bruits CARDIAC: RRR, no murmurs, rubs, gallops RESPIRATORY:  Clear to auscultation without rales, wheezing or rhonchi  ABDOMEN: Soft, non-tender, non-distended EXTREMITIES:  No edema; No deformity   ASSESSMENT AND PLAN  Coronary artery disease Moderate disease with wall motion abnormalities. CT and FFR indicate no stentable blockage. Negative stress test. Symptoms include chest pain (transient and very intermittent), fatigue, and dyspnea. LDL satisfactory, HDL low. - Messaged clinical pharmacist for Ranexa  affordability options. -  Consider Imdur if Ranexa  unaffordable. - Restart aspirin 81 mg if tolerated; consider Plavix if bruising occurs. - Continue atorvastatin  for LDL control. - Encouraged exercise to improve HDL. - Provided Mediterranean diet literature.  Sinus bradycardia/SSS - Monitor heart rate and symptoms. -Follow-up with EP  Essential hypertension Blood pressure low normal. Losartan  100 mg prescribed. - Monitor blood pressure.   Obstructive sleep apnea Fatigue and sleepiness during the day possibly due to CPAP issues. - Troubleshoot CPAP for mask fit and leakage. - Monitor sleep quality and fatigue.  Lower extremity edema Resolved, wearing compression today        Dispo: He can return in 3 months to see MD  Signed, Orren LOISE Fabry, PA-C

## 2024-09-14 ENCOUNTER — Telehealth: Payer: Self-pay

## 2024-09-14 ENCOUNTER — Ambulatory Visit: Admitting: Physician Assistant

## 2024-09-14 ENCOUNTER — Other Ambulatory Visit (HOSPITAL_COMMUNITY): Payer: Self-pay

## 2024-09-14 NOTE — Telephone Encounter (Signed)
 Pharmacy Patient Advocate Encounter   Received notification from Physician's Office that TIER EXCEPTION for RANEXA  is required/requested.   Insurance verification completed.   The patient is insured through [employer].   Per test claim: PA required; PA submitted to above mentioned insurance via Fax Key/confirmation #/EOC NA Status is pending

## 2024-09-14 NOTE — Telephone Encounter (Signed)
-----   Message from Eleanor JONETTA Crews sent at 09/11/2024  4:01 PM EST ----- Can we try a tier exception for ranexa  please?

## 2024-09-16 ENCOUNTER — Other Ambulatory Visit (HOSPITAL_COMMUNITY): Payer: Self-pay

## 2024-09-21 ENCOUNTER — Other Ambulatory Visit (HOSPITAL_COMMUNITY): Payer: Self-pay

## 2024-09-21 ENCOUNTER — Telehealth: Payer: Self-pay | Admitting: Physician Assistant

## 2024-09-21 ENCOUNTER — Telehealth: Payer: Self-pay

## 2024-09-21 NOTE — Telephone Encounter (Signed)
 [employer] reviewer is not paying attention. Request was for tier exception, not PA request. Having to resubmit again over the phone.

## 2024-09-21 NOTE — Telephone Encounter (Signed)
 FAX FROM PLAN REQUESTING ADDITIONAL INFO. REQUESTED INFO HAS BEEN PROVIDED TO [employer] VIA FAX.

## 2024-09-21 NOTE — Telephone Encounter (Signed)
 Patient called to speak with Tessa regarding some medication she was trying to get him. He stated that the one she prescribed him was too expensive but she was supposed to be trying to get him started on another medication. I see the phone note from today from Rx Prior Auth team, assuming it may be in regards to that medication.

## 2024-09-21 NOTE — Telephone Encounter (Addendum)
 [employer] reviewer is not paying attention. Concerns were expressed to rep that request was reviewed incorrectly, which is causing delays in patient care. Reiterated to rep that notes be added to request to pay attention before issuing a determination. Request was for tier exception, not PA request. Having to resubmit again over the phone. Request was expedited.

## 2024-09-21 NOTE — Telephone Encounter (Signed)
 We appreciate you Kelli!

## 2024-09-21 NOTE — Telephone Encounter (Signed)
 Patient was asking if we knew how long it was going to take for this medication approval. Let patient know that claim had to be redone today as [employer] put it in incorrectly. Let pt know we will follow up with him once complete.

## 2024-09-21 NOTE — Telephone Encounter (Signed)
 [employer] RX P/A dept called RA REF PA-F [id number] they said they will cancelling the P/A as prior auth is not required because medication is not the member list of covered drugs.

## 2024-09-21 NOTE — Telephone Encounter (Signed)
 Tier exception is still pending, please see separate encounter for details

## 2024-09-21 NOTE — Telephone Encounter (Signed)
 PA-F [id number]

## 2024-09-21 NOTE — Telephone Encounter (Signed)
-----   Message from Eleanor JONETTA Crews sent at 09/21/2024  9:06 AM EST ----- Can you follow up on this tier exception? ----- Message ----- From: Maccia, Melissa D, RPH-CPP Sent: 09/11/2024   4:02 PM EST To: Eleanor JONETTA Crews, RPH-CPP; Rx Prior [employer]  Can we try a tier exception for ranexa  please?

## 2024-09-24 ENCOUNTER — Other Ambulatory Visit (HOSPITAL_COMMUNITY): Payer: Self-pay

## 2024-09-24 MED ORDER — RANOLAZINE ER 500 MG PO TB12: 500.0000 mg | ORAL_TABLET | Freq: Two times a day (BID) | ORAL | 6 refills | Status: AC

## 2024-09-24 NOTE — Addendum Note (Signed)
 Addended by: Arora Coakley D on: 09/24/2024 04:36 PM   Modules accepted: Orders

## 2024-09-24 NOTE — Telephone Encounter (Signed)
 Per test claim patient should have access to ranexa  for a $0 copay. Tier exception has been approved.

## 2024-09-24 NOTE — Telephone Encounter (Signed)
 Patient made aware tier exception approved

## 2024-12-31 ENCOUNTER — Ambulatory Visit: Admitting: Physician Assistant
